# Patient Record
Sex: Female | Born: 1960 | Race: White | Hispanic: No | Smoking: Former smoker
Health system: Southern US, Community
[De-identification: ages and names within clinical notes are randomized; demographics above are authoritative.]

## PROBLEM LIST (undated history)

## (undated) DIAGNOSIS — I499 Cardiac arrhythmia, unspecified: Secondary | ICD-10-CM

## (undated) DIAGNOSIS — E7800 Pure hypercholesterolemia, unspecified: Secondary | ICD-10-CM

## (undated) DIAGNOSIS — I700 Atherosclerosis of aorta: Secondary | ICD-10-CM

## (undated) DIAGNOSIS — E785 Hyperlipidemia, unspecified: Secondary | ICD-10-CM

## (undated) DIAGNOSIS — E039 Hypothyroidism, unspecified: Secondary | ICD-10-CM

## (undated) DIAGNOSIS — R112 Nausea with vomiting, unspecified: Secondary | ICD-10-CM

## (undated) DIAGNOSIS — Z9889 Other specified postprocedural states: Secondary | ICD-10-CM

## (undated) DIAGNOSIS — J439 Emphysema, unspecified: Secondary | ICD-10-CM

## (undated) HISTORY — DX: Hypothyroidism, unspecified: E03.9

## (undated) HISTORY — DX: Hyperlipidemia, unspecified: E78.5

## (undated) HISTORY — DX: Emphysema, unspecified: J43.9

## (undated) HISTORY — DX: Atherosclerosis of aorta: I70.0

## (undated) HISTORY — PX: TONSILLECTOMY: SUR1361

---

## 2001-02-18 ENCOUNTER — Emergency Department (HOSPITAL_COMMUNITY): Admission: EM | Admit: 2001-02-18 | Discharge: 2001-02-18 | Payer: Self-pay | Admitting: Emergency Medicine

## 2001-04-08 ENCOUNTER — Emergency Department (HOSPITAL_COMMUNITY): Admission: EM | Admit: 2001-04-08 | Discharge: 2001-04-08 | Payer: Self-pay | Admitting: *Deleted

## 2001-08-20 ENCOUNTER — Emergency Department (HOSPITAL_COMMUNITY): Admission: EM | Admit: 2001-08-20 | Discharge: 2001-08-20 | Payer: Self-pay | Admitting: Emergency Medicine

## 2001-11-05 ENCOUNTER — Emergency Department (HOSPITAL_COMMUNITY): Admission: EM | Admit: 2001-11-05 | Discharge: 2001-11-05 | Payer: Self-pay | Admitting: Internal Medicine

## 2002-01-06 ENCOUNTER — Emergency Department (HOSPITAL_COMMUNITY): Admission: EM | Admit: 2002-01-06 | Discharge: 2002-01-06 | Payer: Self-pay | Admitting: Emergency Medicine

## 2004-01-06 ENCOUNTER — Emergency Department (HOSPITAL_COMMUNITY): Admission: EM | Admit: 2004-01-06 | Discharge: 2004-01-07 | Payer: Self-pay | Admitting: *Deleted

## 2004-05-09 ENCOUNTER — Encounter (HOSPITAL_COMMUNITY): Admission: RE | Admit: 2004-05-09 | Discharge: 2004-05-10 | Payer: Self-pay | Admitting: Family Medicine

## 2004-05-31 ENCOUNTER — Emergency Department (HOSPITAL_COMMUNITY): Admission: EM | Admit: 2004-05-31 | Discharge: 2004-06-01 | Payer: Self-pay | Admitting: *Deleted

## 2004-06-13 ENCOUNTER — Ambulatory Visit (HOSPITAL_COMMUNITY): Admission: RE | Admit: 2004-06-13 | Discharge: 2004-06-13 | Payer: Self-pay | Admitting: Internal Medicine

## 2005-07-25 ENCOUNTER — Ambulatory Visit (HOSPITAL_COMMUNITY): Admission: RE | Admit: 2005-07-25 | Discharge: 2005-07-25 | Payer: Self-pay | Admitting: Family Medicine

## 2005-07-30 ENCOUNTER — Ambulatory Visit (HOSPITAL_COMMUNITY): Admission: RE | Admit: 2005-07-30 | Discharge: 2005-07-30 | Payer: Self-pay | Admitting: Family Medicine

## 2005-08-08 ENCOUNTER — Ambulatory Visit: Payer: Self-pay | Admitting: Orthopedic Surgery

## 2006-02-15 ENCOUNTER — Emergency Department (HOSPITAL_COMMUNITY): Admission: EM | Admit: 2006-02-15 | Discharge: 2006-02-15 | Payer: Self-pay | Admitting: Emergency Medicine

## 2006-08-09 ENCOUNTER — Ambulatory Visit (HOSPITAL_COMMUNITY): Admission: RE | Admit: 2006-08-09 | Discharge: 2006-08-09 | Payer: Self-pay | Admitting: Family Medicine

## 2008-02-25 ENCOUNTER — Ambulatory Visit (HOSPITAL_COMMUNITY): Admission: RE | Admit: 2008-02-25 | Discharge: 2008-02-25 | Payer: Self-pay | Admitting: Family Medicine

## 2008-08-02 ENCOUNTER — Emergency Department: Payer: Self-pay | Admitting: Emergency Medicine

## 2009-03-28 ENCOUNTER — Ambulatory Visit (HOSPITAL_COMMUNITY): Admission: RE | Admit: 2009-03-28 | Discharge: 2009-03-28 | Payer: Self-pay | Admitting: Family Medicine

## 2009-03-31 ENCOUNTER — Ambulatory Visit (HOSPITAL_COMMUNITY): Admission: RE | Admit: 2009-03-31 | Discharge: 2009-03-31 | Payer: Self-pay | Admitting: Family Medicine

## 2010-03-30 ENCOUNTER — Ambulatory Visit (HOSPITAL_COMMUNITY)
Admission: RE | Admit: 2010-03-30 | Discharge: 2010-03-30 | Payer: Self-pay | Source: Home / Self Care | Attending: Family Medicine | Admitting: Family Medicine

## 2011-03-09 ENCOUNTER — Emergency Department (HOSPITAL_COMMUNITY)
Admission: EM | Admit: 2011-03-09 | Discharge: 2011-03-09 | Disposition: A | Discharge status: Home / Self Care | Payer: BC Managed Care – PPO | Attending: Emergency Medicine | Admitting: Emergency Medicine

## 2011-03-09 ENCOUNTER — Emergency Department (HOSPITAL_COMMUNITY): Payer: BC Managed Care – PPO

## 2011-03-09 ENCOUNTER — Encounter: Payer: Self-pay | Admitting: *Deleted

## 2011-03-09 DIAGNOSIS — R062 Wheezing: Secondary | ICD-10-CM | POA: Insufficient documentation

## 2011-03-09 DIAGNOSIS — R059 Cough, unspecified: Secondary | ICD-10-CM | POA: Insufficient documentation

## 2011-03-09 DIAGNOSIS — E7800 Pure hypercholesterolemia, unspecified: Secondary | ICD-10-CM | POA: Insufficient documentation

## 2011-03-09 DIAGNOSIS — R0789 Other chest pain: Secondary | ICD-10-CM | POA: Insufficient documentation

## 2011-03-09 DIAGNOSIS — R05 Cough: Secondary | ICD-10-CM

## 2011-03-09 DIAGNOSIS — R0602 Shortness of breath: Secondary | ICD-10-CM | POA: Insufficient documentation

## 2011-03-09 DIAGNOSIS — R6883 Chills (without fever): Secondary | ICD-10-CM | POA: Insufficient documentation

## 2011-03-09 DIAGNOSIS — E079 Disorder of thyroid, unspecified: Secondary | ICD-10-CM | POA: Insufficient documentation

## 2011-03-09 DIAGNOSIS — J3489 Other specified disorders of nose and nasal sinuses: Secondary | ICD-10-CM | POA: Insufficient documentation

## 2011-03-09 DIAGNOSIS — F17200 Nicotine dependence, unspecified, uncomplicated: Secondary | ICD-10-CM | POA: Insufficient documentation

## 2011-03-09 DIAGNOSIS — R093 Abnormal sputum: Secondary | ICD-10-CM | POA: Insufficient documentation

## 2011-03-09 HISTORY — DX: Pure hypercholesterolemia, unspecified: E78.00

## 2011-03-09 MED ORDER — GUAIFENESIN-CODEINE 100-10 MG/5ML PO SYRP: 10.0000 mL | ORAL_SOLUTION | Freq: Three times a day (TID) | ORAL | Status: DC | PRN

## 2011-03-09 MED ORDER — IPRATROPIUM BROMIDE 0.02 % IN SOLN
0.5000 mg | Freq: Once | RESPIRATORY_TRACT | Status: AC
  Administered 2011-03-09: 0.5 mg via RESPIRATORY_TRACT
  Filled 2011-03-09: qty 2.5

## 2011-03-09 MED ORDER — ALBUTEROL SULFATE HFA 108 (90 BASE) MCG/ACT IN AERS
2.0000 | INHALATION_SPRAY | RESPIRATORY_TRACT | Status: DC
  Administered 2011-03-09: 2 via RESPIRATORY_TRACT
  Filled 2011-03-09: qty 6.7

## 2011-03-09 MED ORDER — ALBUTEROL SULFATE (5 MG/ML) 0.5% IN NEBU
5.0000 mg | INHALATION_SOLUTION | Freq: Once | RESPIRATORY_TRACT | Status: AC
  Administered 2011-03-09: 5 mg via RESPIRATORY_TRACT
  Filled 2011-03-09: qty 1

## 2011-03-09 MED ORDER — PREDNISONE 10 MG PO TABS: ORAL_TABLET | ORAL | Status: DC

## 2011-03-09 MED ORDER — AZITHROMYCIN 250 MG PO TABS: ORAL_TABLET | ORAL | Status: DC

## 2011-03-09 NOTE — ED Notes (Signed)
Pt presents with SOB and cough x 3 days. Pt denies sputum.

## 2011-03-09 NOTE — ED Provider Notes (Signed)
History     CSN: [id number] Arrival date & time: 03/09/2011  3:52 PM   First MD Initiated Contact with Patient 03/09/11 1619      Chief Complaint  Patient presents with  . Shortness of Breath  . Cough    (Consider location/radiation/quality/duration/timing/severity/associated sxs/prior treatment) Patient is a 50 y.o. female presenting with cough. The history is provided by the patient.  Cough This is a new problem. The current episode started more than 2 days ago. The problem occurs every few minutes. The problem has not changed since onset.The cough is productive of sputum. There has been no fever. Associated symptoms include chills, shortness of breath and wheezing. Pertinent negatives include no chest pain, no headaches, no rhinorrhea, no sore throat and no myalgias. She has tried nothing for the symptoms. The treatment provided no relief. She is a smoker. Her past medical history is significant for bronchitis. Her past medical history does not include pneumonia, COPD or asthma.    Past Medical History  Diagnosis Date  . Thyroid disease   . Hypercholesteremia     Past Surgical History  Procedure Date  . Cesarean section x2  . Tonsillectomy     No family history on file.  History  Substance Use Topics  . Smoking status: Current Everyday Smoker -- 1.0 packs/day    Types: Cigarettes  . Smokeless tobacco: Not on file  . Alcohol Use: No    OB History    Grav Para Term Preterm Abortions TAB SAB Ect Mult Living                  Review of Systems  Constitutional: Positive for chills. Negative for fever and fatigue.  HENT: Positive for congestion. Negative for sore throat, rhinorrhea, trouble swallowing, neck pain and neck stiffness.   Respiratory: Positive for cough, chest tightness, shortness of breath and wheezing.   Cardiovascular: Negative for chest pain.  Gastrointestinal: Negative for vomiting.  Musculoskeletal: Negative for myalgias and arthralgias.  Skin:  Negative for rash.  Neurological: Negative for dizziness, weakness, numbness and headaches.  Hematological: Does not bruise/bleed easily.  All other systems reviewed and are negative.    Allergies  Review of patient's allergies indicates no known allergies.  Home Medications   Current Outpatient Rx  Name Route Sig Dispense Refill  . IBUPROFEN 200 MG PO TABS Oral Take 800 mg by mouth 2 (two) times daily as needed. For fever and pain     . LEVOTHYROXINE SODIUM 75 MCG PO TABS Oral Take 75 mcg by mouth daily.      Marland Kitchen SIMVASTATIN 20 MG PO TABS Oral Take 20 mg by mouth at bedtime.        BP 108/76  Pulse 72  Temp(Src) 98.6 F (37 C) (Oral)  Resp 18  SpO2 99%  Physical Exam  Nursing note and vitals reviewed. Constitutional: She is oriented to person, place, and time. She appears well-developed and well-nourished. No distress.  HENT:  Head: Normocephalic and atraumatic.  Mouth/Throat: Oropharynx is clear and moist.  Eyes: EOM are normal. Pupils are equal, round, and reactive to light.  Neck: Normal range of motion. Neck supple.  Cardiovascular: Normal rate, regular rhythm and normal heart sounds.   No murmur heard. Pulmonary/Chest: Effort normal. No respiratory distress. She has wheezes. She has no rhonchi. She has no rales. She exhibits no tenderness.  Musculoskeletal: Normal range of motion. She exhibits no edema and no tenderness.  Lymphadenopathy:    She has no cervical adenopathy.  Neurological: She is alert and oriented to person, place, and time. No cranial nerve deficit. She exhibits normal muscle tone. Coordination normal.  Skin: Skin is warm and dry.    ED Course  Procedures (including critical care time)  Labs Reviewed - No data to display Dg Chest 2 View  03/09/2011  *RADIOLOGY REPORT*  Clinical Data: Cough, fever and shortness of breath.  CHEST - 2 VIEW  Comparison: Chest 03/31/2009.  Findings: Lungs clear.  Heart size normal.  No pneumothorax or effusion.   IMPRESSION: No acute disease.  Original Report Authenticated By: Bernadene Bell. D'ALESSIO, M.D.        MDM    6:01 PM patient is feeling better after neb.  Wheezes has improved.  Vitals stable.  Non-toxic appearing.  No hypoxia, tachycardia or tachypnea.  Likely bronchitis.  Will treat with abx, steroids and dispensed inhaler.  Pt agrees to close follow-up with health dept or Return to ER for any worsening symtpomns       Kima Malenfant L. [place], [place] 03/12/11 [phone]

## 2011-03-09 NOTE — ED Notes (Signed)
Pt a/ox4. Resp even and unlabored. NAD at this time. D/C instructions and rx reviewed with pt. Pt verbalized understanding. Pt ambulated to lobby with steady gate.  

## 2011-03-09 NOTE — ED Notes (Signed)
Pt c/o sob and cough since x 3 days

## 2011-03-16 NOTE — ED Provider Notes (Signed)
Evaluation and management procedures were performed by the PA/NP under my supervision/collaboration.    Cayne Yom D Ninfa Giannelli, MD 03/16/11 1119 

## 2011-03-19 ENCOUNTER — Emergency Department (HOSPITAL_COMMUNITY): Payer: BC Managed Care – PPO

## 2011-03-19 ENCOUNTER — Encounter (HOSPITAL_COMMUNITY): Payer: Self-pay

## 2011-03-19 ENCOUNTER — Other Ambulatory Visit: Payer: Self-pay

## 2011-03-19 ENCOUNTER — Emergency Department (HOSPITAL_COMMUNITY)
Admission: EM | Admit: 2011-03-19 | Discharge: 2011-03-19 | Disposition: A | Discharge status: Home / Self Care | Payer: BC Managed Care – PPO | Attending: Emergency Medicine | Admitting: Emergency Medicine

## 2011-03-19 DIAGNOSIS — F17200 Nicotine dependence, unspecified, uncomplicated: Secondary | ICD-10-CM | POA: Insufficient documentation

## 2011-03-19 DIAGNOSIS — R0789 Other chest pain: Secondary | ICD-10-CM | POA: Insufficient documentation

## 2011-03-19 DIAGNOSIS — E7800 Pure hypercholesterolemia, unspecified: Secondary | ICD-10-CM | POA: Insufficient documentation

## 2011-03-19 DIAGNOSIS — R071 Chest pain on breathing: Secondary | ICD-10-CM | POA: Insufficient documentation

## 2011-03-19 DIAGNOSIS — R1011 Right upper quadrant pain: Secondary | ICD-10-CM | POA: Insufficient documentation

## 2011-03-19 DIAGNOSIS — E079 Disorder of thyroid, unspecified: Secondary | ICD-10-CM | POA: Insufficient documentation

## 2011-03-19 LAB — URINALYSIS, ROUTINE W REFLEX MICROSCOPIC
Glucose, UA: NEGATIVE mg/dL
Hgb urine dipstick: NEGATIVE
Protein, ur: NEGATIVE mg/dL
Specific Gravity, Urine: 1.02 (ref 1.005–1.030)
pH: 6 (ref 5.0–8.0)

## 2011-03-19 LAB — COMPREHENSIVE METABOLIC PANEL
Albumin: 3.7 g/dL (ref 3.5–5.2)
Alkaline Phosphatase: 63 U/L (ref 39–117)
BUN: 15 mg/dL (ref 6–23)
Creatinine, Ser: 0.73 mg/dL (ref 0.50–1.10)
GFR calc Af Amer: >90 mL/min (ref >90)
Glucose, Bld: 101 mg/dL — ABNORMAL HIGH (ref 70–99)
Potassium: 4.3 mEq/L (ref 3.5–5.1)
Total Bilirubin: 0.3 mg/dL (ref 0.3–1.2)
Total Protein: 7.1 g/dL (ref 6.0–8.3)

## 2011-03-19 LAB — DIFFERENTIAL
Basophils Relative: 0 % (ref 0–1)
Eosinophils Absolute: 0.2 10*3/uL (ref 0.0–0.7)
Lymphs Abs: 2.7 10*3/uL (ref 0.7–4.0)
Monocytes Absolute: 0.7 10*3/uL (ref 0.1–1.0)
Monocytes Relative: 8 % (ref 3–12)
Neutrophils Relative %: 57 % (ref 43–77)

## 2011-03-19 LAB — LIPASE, BLOOD: Lipase: 59 U/L (ref 11–59)

## 2011-03-19 LAB — CBC
HCT: 42.4 % (ref 36.0–46.0)
Hemoglobin: 14.2 g/dL (ref 12.0–15.0)
MCH: 30 pg (ref 26.0–34.0)
MCHC: 33.5 g/dL (ref 30.0–36.0)
MCV: 89.5 fL (ref 78.0–100.0)
RBC: 4.74 MIL/uL (ref 3.87–5.11)

## 2011-03-19 MED ORDER — HYDROCODONE-ACETAMINOPHEN 5-325 MG PO TABS
2.0000 | ORAL_TABLET | ORAL | Status: DC | PRN
  Filled 2011-03-19: qty 2

## 2011-03-19 MED ORDER — IBUPROFEN 600 MG PO TABS: 600.0000 mg | ORAL_TABLET | Freq: Three times a day (TID) | ORAL | Status: AC | PRN

## 2011-03-19 MED ORDER — HYDROMORPHONE HCL PF 1 MG/ML IJ SOLN
1.0000 mg | Freq: Once | INTRAMUSCULAR | Status: DC
  Filled 2011-03-19: qty 1

## 2011-03-19 MED ORDER — ONDANSETRON 8 MG PO TBDP
8.0000 mg | ORAL_TABLET | Freq: Once | ORAL | Status: AC
  Administered 2011-03-19: 8 mg via ORAL
  Filled 2011-03-19: qty 1

## 2011-03-19 MED ORDER — IBUPROFEN 400 MG PO TABS
600.0000 mg | ORAL_TABLET | Freq: Three times a day (TID) | ORAL | Status: DC | PRN
  Administered 2011-03-19: 600 mg via ORAL
  Filled 2011-03-19: qty 2

## 2011-03-19 MED ORDER — HYDROCODONE-ACETAMINOPHEN 5-325 MG PO TABS: 2.0000 | ORAL_TABLET | ORAL | Status: AC | PRN

## 2011-03-19 NOTE — ED Provider Notes (Signed)
Scribed for Felisa Bonier, MD, the patient was seen in room [facility] . This chart was scribed by Ellie Lunch.   CSN: [id number] Arrival date & time: 03/19/2011  8:04 AM   First MD Initiated Contact with Patient 03/19/11 [phone]      Chief Complaint  Patient presents with  . Chest Pain    (Consider location/radiation/quality/duration/timing/severity/associated sxs/prior treatment) HPI Pt seen at 8:45 AM Alejandra Mcdaniel is a 50 y.o. female who presents to the Emergency Department complaining of sudden onset anterior chest wall pain located under right breast for the past week. Pt states that the pain started last week and describes it as sharp pains that radiate with palpation to her back. Pt reports the pain worsening with coughing, movement and eating. She has some associated shortness of breath and nausea but denies any fever or palpitations. Pt was treated with bronchitis last week and her symptoms have significantly improved since then.   Past Medical History  Diagnosis Date  . Thyroid disease   . Hypercholesteremia     Past Surgical History  Procedure Date  . Cesarean section x2  . Tonsillectomy     No family history on file.  History  Substance Use Topics  . Smoking status: Current Everyday Smoker -- 1.0 packs/day    Types: Cigarettes  . Smokeless tobacco: Not on file  . Alcohol Use: No     Review of Systems 10 Systems reviewed and are negative for acute change except as noted in the HPI.   Allergies  Review of patient's allergies indicates no known allergies.  Home Medications   Current Outpatient Rx  Name Route Sig Dispense Refill  . AZITHROMYCIN 250 MG PO TABS  Take two tablets on day one, then one tab qd days 2-5 6 tablet 0  . GUAIFENESIN-CODEINE 100-10 MG/5ML PO SYRP Oral Take 10 mLs by mouth 3 (three) times daily as needed for cough. 120 mL 0  . IBUPROFEN 200 MG PO TABS Oral Take 800 mg by mouth 2 (two) times daily as needed. For fever and pain      . LEVOTHYROXINE SODIUM 75 MCG PO TABS Oral Take 75 mcg by mouth daily.      Marland Kitchen PREDNISONE 10 MG PO TABS  Take 6 tablets day one, 5 tablets day two, 4 tablets day three, 3 tablets day four, 2 tablets day five, then 1 tablet day six 21 tablet 0  . SIMVASTATIN 20 MG PO TABS Oral Take 20 mg by mouth at bedtime.        BP 105/73  Pulse 79  Temp(Src) 98.2 F (36.8 C) (Oral)  Resp 18  Ht 5\' 2"  (1.575 m)  Wt 140 lb (63.504 kg)  BMI 25.61 kg/m2  SpO2 95%  Physical Exam  Nursing note and vitals reviewed. Constitutional: She is oriented to person, place, and time. She appears well-developed and well-nourished. No distress.  HENT:  Head: Normocephalic and atraumatic.  Eyes: Conjunctivae are normal. Pupils are equal, round, and reactive to light.  Neck: Normal range of motion. Neck supple.  Cardiovascular: Normal rate, regular rhythm and normal heart sounds.  Exam reveals no gallop and no friction rub.   No murmur heard. Pulmonary/Chest: She has wheezes (subtle inspiratory wheeze bilaterally). She has no rales.       No rhonchi Chest wall is tender to right lower anterior aspect with no deformity or crepitus  Abdominal: Soft. Bowel sounds are normal. There is tenderness (RUQ tenderness noted). There is no rebound  and no guarding.  Neurological: She is alert and oriented to person, place, and time.  Skin: Skin is warm and dry.    ED Course  Procedures (including critical care time)  Date: 03/19/2011  Rate: 61  Rhythm: normal sinus rhythm  QRS Axis: normal  Intervals: normal  ST/T Wave abnormalities: normal  Conduction Disutrbances:none  Narrative Interpretation: Non-provocative EKG  Old EKG Reviewed: none available   Labs Reviewed - No data to display No results found. DIAGNOSTIC STUDIES: Oxygen Saturation is 99% on room air, normal by my interpretation.    COORDINATION OF CARE:     No diagnosis found.    MDM  8:50 AM Acute cholecystitis, cholithiasis, pancreatitis,  bronchitis, musculoskeletal chest pain. Acute mycardial infarction is not suggested by the physical exam or history.   I personally performed the services described in this documentation, which was scribed in my presence. The recorded information has been reviewed and considered.   10:43 AM No apparent intra-abdominal or cardiac cause of the patient's symptoms. She appears to have chest wall pain and resolving bronchitis.  Felisa Bonier, MD 03/19/11 [phone]

## 2011-03-19 NOTE — ED Notes (Signed)
Pt reports was seen here  A week ago with bronchitis.  Reports had been coughing a lot.  Pt says last Tuesday pt started having pain under R breast radiating around to back.  Reports pain is much worse with movement and coughing.

## 2011-03-20 ENCOUNTER — Other Ambulatory Visit (HOSPITAL_COMMUNITY): Payer: Self-pay | Admitting: Family Medicine

## 2011-03-20 DIAGNOSIS — Z139 Encounter for screening, unspecified: Secondary | ICD-10-CM

## 2011-04-02 ENCOUNTER — Ambulatory Visit (HOSPITAL_COMMUNITY)
Admission: RE | Admit: 2011-04-02 | Discharge: 2011-04-02 | Disposition: A | Discharge status: Home / Self Care | Payer: BC Managed Care – PPO | Source: Ambulatory Visit | Attending: Family Medicine | Admitting: Family Medicine

## 2011-04-02 DIAGNOSIS — Z139 Encounter for screening, unspecified: Secondary | ICD-10-CM

## 2011-04-02 DIAGNOSIS — Z1231 Encounter for screening mammogram for malignant neoplasm of breast: Secondary | ICD-10-CM | POA: Insufficient documentation

## 2011-07-16 ENCOUNTER — Other Ambulatory Visit (HOSPITAL_COMMUNITY)
Admission: RE | Admit: 2011-07-16 | Discharge: 2011-07-16 | Disposition: A | Discharge status: Home / Self Care | Payer: BC Managed Care – PPO | Source: Ambulatory Visit | Attending: Obstetrics & Gynecology | Admitting: Obstetrics & Gynecology

## 2011-07-16 DIAGNOSIS — Z01419 Encounter for gynecological examination (general) (routine) without abnormal findings: Secondary | ICD-10-CM | POA: Insufficient documentation

## 2012-04-09 ENCOUNTER — Other Ambulatory Visit (HOSPITAL_COMMUNITY): Payer: Self-pay | Admitting: Family Medicine

## 2012-04-09 DIAGNOSIS — Z139 Encounter for screening, unspecified: Secondary | ICD-10-CM

## 2012-04-14 ENCOUNTER — Ambulatory Visit (HOSPITAL_COMMUNITY)
Admission: RE | Admit: 2012-04-14 | Discharge: 2012-04-14 | Disposition: A | Discharge status: Home / Self Care | Payer: BC Managed Care – PPO | Source: Ambulatory Visit | Attending: Family Medicine | Admitting: Family Medicine

## 2012-04-14 DIAGNOSIS — Z139 Encounter for screening, unspecified: Secondary | ICD-10-CM

## 2012-04-14 DIAGNOSIS — Z1231 Encounter for screening mammogram for malignant neoplasm of breast: Secondary | ICD-10-CM | POA: Insufficient documentation

## 2012-10-23 ENCOUNTER — Other Ambulatory Visit: Payer: Self-pay | Admitting: Obstetrics & Gynecology

## 2012-11-10 ENCOUNTER — Encounter: Payer: Self-pay | Admitting: Obstetrics & Gynecology

## 2012-11-10 ENCOUNTER — Other Ambulatory Visit (HOSPITAL_COMMUNITY)
Admission: RE | Admit: 2012-11-10 | Discharge: 2012-11-10 | Disposition: A | Discharge status: Home / Self Care | Payer: BC Managed Care – PPO | Source: Ambulatory Visit | Attending: Obstetrics & Gynecology | Admitting: Obstetrics & Gynecology

## 2012-11-10 ENCOUNTER — Ambulatory Visit (INDEPENDENT_AMBULATORY_CARE_PROVIDER_SITE_OTHER): Payer: BC Managed Care – PPO | Admitting: Obstetrics & Gynecology

## 2012-11-10 VITALS — BP 100/60 | Ht 62.0 in | Wt 140.0 lb

## 2012-11-10 DIAGNOSIS — Z01419 Encounter for gynecological examination (general) (routine) without abnormal findings: Secondary | ICD-10-CM | POA: Insufficient documentation

## 2012-11-10 DIAGNOSIS — Z1151 Encounter for screening for human papillomavirus (HPV): Secondary | ICD-10-CM | POA: Insufficient documentation

## 2012-11-10 MED ORDER — METRONIDAZOLE 0.75 % VA GEL: 1.0000 | VAGINAL | Status: DC

## 2012-11-10 MED ORDER — ESTRADIOL 0.1 MG/GM VA CREA: 2.0000 g | TOPICAL_CREAM | Freq: Every day | VAGINAL | Status: DC

## 2012-11-10 NOTE — Progress Notes (Signed)
Patient ID: Alejandra Mcdaniel, female   DOB: 09-08-1960, 52 y.o.   MRN: [id number] Subjective:     Alejandra Mcdaniel is a 52 y.o. female here for a routine exam.  No LMP recorded. Patient is postmenopausal. No obstetric history on file. Current complaints: none.  Personal health questionnaire reviewed: no.   Gynecologic History No LMP recorded. Patient is postmenopausal. Contraception: post menopausal status Last Pap: 2013. Results were: normal Last mammogram: 2013. Results were: normal  Obstetric History OB History   Grav Para Term Preterm Abortions TAB SAB Ect Mult Living                   The following portions of the patient's history were reviewed and updated as appropriate: allergies, current medications, past family history, past medical history, past social history, past surgical history and problem list.  Review of Systems  Review of Systems  Constitutional: Negative for fever, chills, weight loss, malaise/fatigue and diaphoresis.  HENT: Negative for hearing loss, ear pain, nosebleeds, congestion, sore throat, neck pain, tinnitus and ear discharge.   Eyes: Negative for blurred vision, double vision, photophobia, pain, discharge and redness.  Respiratory: Negative for cough, hemoptysis, sputum production, shortness of breath, wheezing and stridor.   Cardiovascular: Negative for chest pain, palpitations, orthopnea, claudication, leg swelling and PND.  Gastrointestinal: negative for abdominal pain. Negative for heartburn, nausea, vomiting, diarrhea, constipation, blood in stool and melena.  Genitourinary: Negative for dysuria, urgency, frequency, hematuria and flank pain.  Musculoskeletal: Negative for myalgias, back pain, joint pain and falls.  Skin: Negative for itching and rash.  Neurological: Negative for dizziness, tingling, tremors, sensory change, speech change, focal weakness, seizures, loss of consciousness, weakness and headaches.  Endo/Heme/Allergies: Negative for  environmental allergies and polydipsia. Does not bruise/bleed easily.  Psychiatric/Behavioral: Negative for depression, suicidal ideas, hallucinations, memory loss and substance abuse. The patient is not nervous/anxious and does not have insomnia.        Objective:    Physical Exam  Vitals reviewed. Constitutional: She is oriented to person, place, and time. She appears well-developed and well-nourished.  HENT:  Head: Normocephalic and atraumatic.        Right Ear: External ear normal.  Left Ear: External ear normal.  Nose: Nose normal.  Mouth/Throat: Oropharynx is clear and moist.  Eyes: Conjunctivae and EOM are normal. Pupils are equal, round, and reactive to light. Right eye exhibits no discharge. Left eye exhibits no discharge. No scleral icterus.  Neck: Normal range of motion. Neck supple. No tracheal deviation present. No thyromegaly present.  Cardiovascular: Normal rate, regular rhythm, normal heart sounds and intact distal pulses.  Exam reveals no gallop and no friction rub.   No murmur heard. Respiratory: Effort normal and breath sounds normal. No respiratory distress. She has no wheezes. She has no rales. She exhibits no tenderness.  GI: Soft. Bowel sounds are normal. She exhibits no distension and no mass. There is no tenderness. There is no rebound and no guarding.  Genitourinary:       Vulva is normal without lesions Vagina is pink moist without discharge Cervix normal in appearance and pap is done Uterus is normal size shape and contour Adnexa is negative with normal sized ovaries  Rectal hemeoccult negative, no masses, normal tone  Musculoskeletal: Normal range of motion. She exhibits no edema and no tenderness.  Neurological: She is alert and oriented to person, place, and time. She has normal reflexes. She displays normal reflexes. No cranial nerve deficit. She exhibits  normal muscle tone. Coordination normal.  Skin: Skin is warm and dry. No rash noted. No erythema. No  pallor.  Psychiatric: She has a normal mood and affect. Her behavior is normal. Judgment and thought content normal.       Assessment:    Healthy female exam.    Plan:    Mammogram ordered. Follow up in: 1 year.

## 2012-11-10 NOTE — Patient Instructions (Signed)
Bacterial Vaginosis Bacterial vaginosis (BV) is a vaginal infection where the normal balance of bacteria in the vagina is disrupted. The normal balance is then replaced by an overgrowth of certain bacteria. There are several different kinds of bacteria that can cause BV. BV is the most common vaginal infection in women of childbearing age. CAUSES   The cause of BV is not fully understood. BV develops when there is an increase or imbalance of harmful bacteria.  Some activities or behaviors can upset the normal balance of bacteria in the vagina and put women at increased risk including:  Having a new sex partner or multiple sex partners.  Douching.  Using an intrauterine device (IUD) for contraception.  It is not clear what role sexual activity plays in the development of BV. However, women that have never had sexual intercourse are rarely infected with BV. Women do not get BV from toilet seats, bedding, swimming pools or from touching objects around them.  SYMPTOMS   Grey vaginal discharge.  A fish-like odor with discharge, especially after sexual intercourse.  Itching or burning of the vagina and vulva.  Burning or pain with urination.  Some women have no signs or symptoms at all. DIAGNOSIS  Your caregiver must examine the vagina for signs of BV. Your caregiver will perform lab tests and look at the sample of vaginal fluid through a microscope. They will look for bacteria and abnormal cells (clue cells), a pH test higher than 4.5, and a positive amine test all associated with BV.  RISKS AND COMPLICATIONS   Pelvic inflammatory disease (PID).  Infections following gynecology surgery.  Developing HIV.  Developing herpes virus. TREATMENT  Sometimes BV will clear up without treatment. However, all women with symptoms of BV should be treated to avoid complications, especially if gynecology surgery is planned. Female partners generally do not need to be treated. However, BV may spread  between female sex partners so treatment is helpful in preventing a recurrence of BV.   BV may be treated with antibiotics. The antibiotics come in either pill or vaginal cream forms. Either can be used with nonpregnant or pregnant women, but the recommended dosages differ. These antibiotics are not harmful to the baby.  BV can recur after treatment. If this happens, a second round of antibiotics will often be prescribed.  Treatment is important for pregnant women. If not treated, BV can cause a premature delivery, especially for a pregnant woman who had a premature birth in the past. All pregnant women who have symptoms of BV should be checked and treated.  For chronic reoccurrence of BV, treatment with a type of prescribed gel vaginally twice a week is helpful. HOME CARE INSTRUCTIONS   Finish all medication as directed by your caregiver.  Do not have sex until treatment is completed.  Tell your sexual partner that you have a vaginal infection. They should see their caregiver and be treated if they have problems, such as a mild rash or itching.  Practice safe sex. Use condoms. Only have 1 sex partner. PREVENTION  Basic prevention steps can help reduce the risk of upsetting the natural balance of bacteria in the vagina and developing BV:  Do not have sexual intercourse (be abstinent).  Do not douche.  Use all of the medicine prescribed for treatment of BV, even if the signs and symptoms go away.  Tell your sex partner if you have BV. That way, they can be treated, if needed, to prevent reoccurrence. SEEK MEDICAL CARE IF:     Your symptoms are not improving after 3 days of treatment.  You have increased discharge, pain, or fever. MAKE SURE YOU:   Understand these instructions.  Will watch your condition.  Will get help right away if you are not doing well or get worse. FOR MORE INFORMATION  Division of STD Prevention (DSTDP), Centers for Disease Control and Prevention:  www.cdc.gov/std American [employer] Association (ASHA): www.ashastd.org  Document Released: 03/19/2005 Document Revised: 06/11/2011 Document Reviewed: 09/09/2008 [employer] Patient Information 2014 [employer].  

## 2012-11-10 NOTE — Addendum Note (Signed)
Addended by: Richardson Chiquito on: 11/10/2012 05:00 PM   Modules accepted: Orders

## 2014-10-11 ENCOUNTER — Other Ambulatory Visit: Payer: Self-pay | Admitting: Family Medicine

## 2014-10-11 DIAGNOSIS — E7800 Pure hypercholesterolemia, unspecified: Secondary | ICD-10-CM | POA: Insufficient documentation

## 2014-10-11 DIAGNOSIS — E039 Hypothyroidism, unspecified: Secondary | ICD-10-CM | POA: Insufficient documentation

## 2014-10-11 DIAGNOSIS — E038 Other specified hypothyroidism: Secondary | ICD-10-CM

## 2014-10-11 DIAGNOSIS — Z5181 Encounter for therapeutic drug level monitoring: Secondary | ICD-10-CM | POA: Insufficient documentation

## 2014-10-11 NOTE — Telephone Encounter (Signed)
Due for fasting labs please I put in orders for TSH, lipids, sgpt Ask her to make lab appt in the next week or two I sent Rx Thanks

## 2014-10-11 NOTE — Telephone Encounter (Signed)
Routing to provider  

## 2014-10-28 DIAGNOSIS — E785 Hyperlipidemia, unspecified: Secondary | ICD-10-CM | POA: Insufficient documentation

## 2014-10-28 DIAGNOSIS — E039 Hypothyroidism, unspecified: Secondary | ICD-10-CM | POA: Insufficient documentation

## 2014-10-29 ENCOUNTER — Ambulatory Visit (INDEPENDENT_AMBULATORY_CARE_PROVIDER_SITE_OTHER): Payer: BLUE CROSS/BLUE SHIELD | Admitting: Family Medicine

## 2014-10-29 ENCOUNTER — Encounter: Payer: Self-pay | Admitting: Family Medicine

## 2014-10-29 VITALS — BP 108/72 | HR 56 | Temp 97.7°F | Ht 62.75 in | Wt 133.0 lb

## 2014-10-29 DIAGNOSIS — Z0000 Encounter for general adult medical examination without abnormal findings: Secondary | ICD-10-CM | POA: Diagnosis not present

## 2014-10-29 DIAGNOSIS — E785 Hyperlipidemia, unspecified: Secondary | ICD-10-CM

## 2014-10-29 DIAGNOSIS — Z1159 Encounter for screening for other viral diseases: Secondary | ICD-10-CM

## 2014-10-29 DIAGNOSIS — Z720 Tobacco use: Secondary | ICD-10-CM

## 2014-10-29 DIAGNOSIS — Z1239 Encounter for other screening for malignant neoplasm of breast: Secondary | ICD-10-CM | POA: Diagnosis not present

## 2014-10-29 DIAGNOSIS — Z1211 Encounter for screening for malignant neoplasm of colon: Secondary | ICD-10-CM | POA: Diagnosis not present

## 2014-10-29 DIAGNOSIS — N9489 Other specified conditions associated with female genital organs and menstrual cycle: Secondary | ICD-10-CM

## 2014-10-29 DIAGNOSIS — Z124 Encounter for screening for malignant neoplasm of cervix: Secondary | ICD-10-CM

## 2014-10-29 DIAGNOSIS — Z113 Encounter for screening for infections with a predominantly sexual mode of transmission: Secondary | ICD-10-CM

## 2014-10-29 DIAGNOSIS — N898 Other specified noninflammatory disorders of vagina: Secondary | ICD-10-CM

## 2014-10-29 DIAGNOSIS — E039 Hypothyroidism, unspecified: Secondary | ICD-10-CM

## 2014-10-29 DIAGNOSIS — Z789 Other specified health status: Secondary | ICD-10-CM

## 2014-10-29 LAB — WET PREP FOR TRICH, YEAST, CLUE
Clue Cell Exam: NEGATIVE
TRICHOMONAS EXAM: NEGATIVE
Yeast Exam: NEGATIVE

## 2014-10-29 NOTE — Progress Notes (Signed)
Patient ID: Alejandra Mcdaniel, female   DOB: [birth year], 54 y.o.   MRN: [id number]   Subjective:   Alejandra Mcdaniel is a 54 y.o. female here for a complete physical exam  Interim issues since last visit: none Last pap smear was 2-3 years ago No hx of abnormal paps Does breast exams, no lumps or bumps  no alcohol  Depression screen PHQ 2/9 10/29/2014  Decreased Interest 0  Down, Depressed, Hopeless 0  PHQ - 2 Score 0   BP excellent today Abdominal fat collection over last few years; mother and father have both had bypasses She does not take aspirin or cholesterol medicine Bad aches from statin; started taking fish oil pills  Used to smoke until Sept of 2014, switched to nicotine electronic cigarettes; discussed risk of B.O. And flavorings No fam hx of breast/ovarian cancer Pap smear today Check fasting lipids off of statin and on fish oil Glucose was normal in May Colonoscopy screening: never had any screening No tanning, but used to tan Diet/exercise: eats one a time (supper); less than 3 eggs per week; diet low in saturated fats  Past Medical History  Diagnosis Date  . Hyperlipidemia   . Hypothyroidism    Past Surgical History  Procedure Laterality Date  . Cesarean section      x 2  . Tonsillectomy     Family History  Problem Relation Age of Onset  . Cancer Mother     cervical  . Heart disease Mother   . Diabetes Mother   . Hyperlipidemia Mother   . Heart disease Father   . Diabetes Father   . Hyperlipidemia Father   . Thyroid disease Father   . Diabetes Sister   . COPD Sister   . Cancer Brother     kidney  . Diabetes Brother   . Hypertension Brother    History  Substance Use Topics  . Smoking status: Former Smoker -- 2.00 packs/day for 40 years    Types: Cigarettes, E-cigarettes    Quit date: 12/01/2012  . Smokeless tobacco: Never Used  . Alcohol Use: No   Review of Systems  Objective:   Filed Vitals:   10/29/14 1339  BP: 108/72  Pulse: 56   Temp: 97.7 F (36.5 C)  Height: 5' 2.75" (1.594 m)  Weight: 133 lb (60.328 kg)  SpO2: 98%   Body mass index is 23.74 kg/(m^2). Wt Readings from Last 3 Encounters:  10/29/14 133 lb (60.328 kg)  08/05/14 128 lb (58.06 kg)   Physical Exam  Constitutional: She appears well-developed and well-nourished.  HENT:  Head: Normocephalic and atraumatic.  Eyes: Conjunctivae and EOM are normal. Right eye exhibits no hordeolum. Left eye exhibits no hordeolum. No scleral icterus.  Neck: Carotid bruit is not present. No thyromegaly present.  Cardiovascular: Normal rate, regular rhythm, S1 normal, S2 normal and normal heart sounds.   No extrasystoles are present.  Pulmonary/Chest: Effort normal and breath sounds normal. No respiratory distress. Right breast exhibits no inverted nipple, no mass, no nipple discharge, no skin change and no tenderness. Left breast exhibits no inverted nipple, no mass, no nipple discharge, no skin change and no tenderness. Breasts are symmetrical.  Abdominal: Soft. Normal appearance and bowel sounds are normal. She exhibits no distension, no abdominal bruit, no pulsatile midline mass and no mass. There is no hepatosplenomegaly. There is no tenderness. No hernia.  Genitourinary: Uterus normal. Pelvic exam was performed with patient prone. There is no rash or lesion on the right labia. There  is no rash or lesion on the left labia. Cervix exhibits no motion tenderness. Right adnexum displays no mass, no tenderness and no fullness. Left adnexum displays no mass, no tenderness and no fullness. Vaginal discharge (scant thin discharge; collected for wet moutn) found.  Musculoskeletal: Normal range of motion. She exhibits no edema.  Lymphadenopathy:       Head (right side): No submandibular adenopathy present.       Head (left side): No submandibular adenopathy present.    She has no cervical adenopathy.    She has no axillary adenopathy.  Neurological: She is alert. She displays no  tremor. No cranial nerve deficit. She exhibits normal muscle tone. Gait normal.  Skin: Skin is warm and dry. No bruising and no ecchymosis noted. No cyanosis. No pallor.  Psychiatric: Her speech is normal and behavior is normal. Thought content normal. Her mood appears not anxious. She does not exhibit a depressed mood.    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; on fish oil; fasting except for 1/2 minute maid peach soda; check today      Relevant Orders   Lipid Panel w/o Chol/HDL Ratio (Completed)   Colon cancer screening    Recommended colonoscopy, should have started at age 35; patient now willing to go      Relevant Orders   Ambulatory referral to Gastroenterology   Breast cancer screening    CBE today; SBE monthly; mammograms every 1-2 years      Relevant Orders   MM DIGITAL SCREENING BILATERAL   Electronic cigarette use    Warned of flavored cigarettes, vapors, bronchiolitis obliterans; complete cessation would be best      Preventative health care - Primary    Age-appropriate guidance and screening based on A and B USPSTF recommendations as of April 9563 publication      Screening for cervical cancer    Thin prep collected with HPV co-testing      Relevant Orders   Pap liquid-based and HPV (high risk)    Other Visit Diagnoses    Screening for viral disease        Relevant Orders    HIV antibody (with reflex) (Completed)    Hepatitis B Surface AntiBODY (Completed)    Hepatitis B Surface AntiGEN (Completed)    Hepatitis C Antibody (Completed)    Vaginal odor        Relevant Orders    WET PREP FOR [place], YEAST, CLUE (Completed)    Screen for sexually transmitted diseases        Relevant Orders    RPR (Completed)        Meds ordered this encounter  Medications  . DISCONTD:  levothyroxine (SYNTHROID, LEVOTHROID) 50 MCG tablet    Sig: Take 50 mcg by mouth daily before breakfast. Take 1 one day and 1 1/2 the next day  . cholecalciferol (VITAMIN D) 1000 UNITS tablet    Sig: Take 1,000 Units by mouth daily.  . Omega-3 Fatty Acids (FISH OIL PO)    Sig: Take by mouth 2 (two) times daily.   Orders Placed This Encounter  Procedures  . WET PREP FOR [place], YEAST, CLUE  . MM DIGITAL SCREENING BILATERAL    Standing Status: Future  Number of Occurrences:      Standing Expiration Date: 10/29/2015    Order Specific Question:  Reason for Exam (SYMPTOM  OR DIAGNOSIS REQUIRED)    Answer:  screening    Order Specific Question:  Is the patient pregnant?    Answer:  No    Order Specific Question:  Preferred imaging location?    Answer:  [facility]  . HIV antibody (with reflex)  . Hepatitis B Surface AntiBODY  . Hepatitis B Surface AntiGEN  . Hepatitis C Antibody  . RPR  . TSH  . Lipid Panel w/o Chol/HDL Ratio  . Ambulatory referral to Gastroenterology    Referral Priority:  Routine    Referral Type:  Consultation    Referral Reason:  Specialty Services Required    Number of Visits Requested:  1    Follow up plan: Return in about 1 year (around 10/29/2015) for physical.  An after-visit summary was printed and given to the patient at [place].  Please see the patient instructions which may contain other information and recommendations beyond what is mentioned above in the assessment and plan.

## 2014-10-29 NOTE — Patient Instructions (Addendum)
Please call [place] after your 54th birthday to get a low dose chest CT scheduled Please call to schedule a mammogram We'll call you about the lab results Start 81 mg coated aspirin daily I've put in the referral for you to have a colonoscopy I would recommend (strongly) that you quit using the vapor cigarettes  Health Maintenance Adopting a healthy lifestyle and getting preventive care can go a long way to promote health and wellness. Talk with your health care provider about what schedule of regular examinations is right for you. This is a good chance for you to check in with your provider about disease prevention and staying healthy. In between checkups, there are plenty of things you can do on your own. Experts have done a lot of research about which lifestyle changes and preventive measures are most likely to keep you healthy. Ask your health care provider for more information. WEIGHT AND DIET  Eat a healthy diet  Be sure to include plenty of vegetables, fruits, low-fat dairy products, and lean protein.  Do not eat a lot of foods high in solid fats, added sugars, or salt.  Get regular exercise. This is one of the most important things you can do for your health.  Most adults should exercise for at least 150 minutes each week. The exercise should increase your heart rate and make you sweat (moderate-intensity exercise).  Most adults should also do strengthening exercises at least twice a week. This is in addition to the moderate-intensity exercise.  Maintain a healthy weight  Body mass index (BMI) is a measurement that can be used to identify possible weight problems. It estimates body fat based on height and weight. Your health care provider can help determine your BMI and help you achieve or maintain a healthy weight.  For females 24 years of age and older:   A BMI below 18.5 is considered underweight.  A BMI of 18.5 to 24.9 is normal.  A BMI of 25 to 29.9 is considered  overweight.  A BMI of 30 and above is considered obese.  Watch levels of cholesterol and blood lipids  You should start having your blood tested for lipids and cholesterol at 55 years of age, then have this test every 5 years.  You may need to have your cholesterol levels checked more often if:  Your lipid or cholesterol levels are high.  You are older than 55 years of age.  You are at high risk for heart disease.  CANCER SCREENING   Lung Cancer  Lung cancer screening is recommended for adults 8-78 years old who are at high risk for lung cancer because of a history of smoking.  A yearly low-dose CT scan of the lungs is recommended for people who:  Currently smoke.  Have quit within the past 15 years.  Have at least a 30-pack-year history of smoking. A pack year is smoking an average of one pack of cigarettes a day for 1 year.  Yearly screening should continue until it has been 15 years since you quit.  Yearly screening should stop if you develop a health problem that would prevent you from having lung cancer treatment.  Breast Cancer  Practice breast self-awareness. This means understanding how your breasts normally appear and feel.  It also means doing regular breast self-exams. Let your health care provider know about any changes, no matter how small.  If you are in your 20s or 30s, you should have a clinical breast exam (CBE) by a  health care provider every 1-3 years as part of a regular health exam.  If you are 26 or older, have a CBE every year. Also consider having a breast X-ray (mammogram) every year.  If you have a family history of breast cancer, talk to your health care provider about genetic screening.  If you are at high risk for breast cancer, talk to your health care provider about having an MRI and a mammogram every year.  Breast cancer gene (BRCA) assessment is recommended for women who have family members with BRCA-related cancers. BRCA-related  cancers include:  Breast.  Ovarian.  Tubal.  Peritoneal cancers.  Results of the assessment will determine the need for genetic counseling and BRCA1 and BRCA2 testing. Cervical Cancer Routine pelvic examinations to screen for cervical cancer are no longer recommended for nonpregnant women who are considered low risk for cancer of the pelvic organs (ovaries, uterus, and vagina) and who do not have symptoms. A pelvic examination may be necessary if you have symptoms including those associated with pelvic infections. Ask your health care provider if a screening pelvic exam is right for you.   The Pap test is the screening test for cervical cancer for women who are considered at risk.  If you had a hysterectomy for a problem that was not cancer or a condition that could lead to cancer, then you no longer need Pap tests.  If you are older than 65 years, and you have had normal Pap tests for the past 10 years, you no longer need to have Pap tests.  If you have had past treatment for cervical cancer or a condition that could lead to cancer, you need Pap tests and screening for cancer for at least 20 years after your treatment.  If you no longer get a Pap test, assess your risk factors if they change (such as having a new sexual partner). This can affect whether you should start being screened again.  Some women have medical problems that increase their chance of getting cervical cancer. If this is the case for you, your health care provider may recommend more frequent screening and Pap tests.  The human papillomavirus (HPV) test is another test that may be used for cervical cancer screening. The HPV test looks for the virus that can cause cell changes in the cervix. The cells collected during the Pap test can be tested for HPV.  The HPV test can be used to screen women 22 years of age and older. Getting tested for HPV can extend the interval between normal Pap tests from three to five  years.  An HPV test also should be used to screen women of any age who have unclear Pap test results.  After 55 years of age, women should have HPV testing as often as Pap tests.  Colorectal Cancer  This type of cancer can be detected and often prevented.  Routine colorectal cancer screening usually begins at 55 years of age and continues through 55 years of age.  Your health care provider may recommend screening at an earlier age if you have risk factors for colon cancer.  Your health care provider may also recommend using home test kits to check for hidden blood in the stool.  A small camera at the end of a tube can be used to examine your colon directly (sigmoidoscopy or colonoscopy). This is done to check for the earliest forms of colorectal cancer.  Routine screening usually begins at age 60.  Direct examination of  colon should be repeated every 5-10 years through 55 years of age. However, you may need to be screened more often if early forms of precancerous polyps or small growths are found. Skin Cancer  Check your skin from head to toe regularly.  Tell your health care provider about any new moles or changes in moles, especially if there is a change in a mole's shape or color.  Also tell your health care provider if you have a mole that is larger than the size of a pencil eraser.  Always use sunscreen. Apply sunscreen liberally and repeatedly throughout the day.  Protect yourself by wearing long sleeves, pants, a wide-brimmed hat, and sunglasses whenever you are outside. HEART DISEASE, DIABETES, AND HIGH BLOOD PRESSURE   Have your blood pressure checked at least every 1-2 years. High blood pressure causes heart disease and increases the risk of stroke.  If you are between 55 years and 79 years old, ask your health care provider if you should take aspirin to prevent strokes.  Have regular diabetes screenings. This involves taking a blood sample to check your fasting  blood sugar level.  If you are at a normal weight and have a low risk for diabetes, have this test once every three years after 55 years of age.  If you are overweight and have a high risk for diabetes, consider being tested at a younger age or more often. PREVENTING INFECTION  Hepatitis B  If you have a higher risk for hepatitis B, you should be screened for this virus. You are considered at high risk for hepatitis B if:  You were born in a country where hepatitis B is common. Ask your health care provider which countries are considered high risk.  Your parents were born in a high-risk country, and you have not been immunized against hepatitis B (hepatitis B vaccine).  You have HIV or AIDS.  You use needles to inject street drugs.  You live with someone who has hepatitis B.  You have had sex with someone who has hepatitis B.  You get hemodialysis treatment.  You take certain medicines for conditions, including cancer, organ transplantation, and autoimmune conditions. Hepatitis C  Blood testing is recommended for:  Everyone born from 1945 through 1965.  Anyone with known risk factors for hepatitis C. Sexually transmitted infections (STIs)  You should be screened for sexually transmitted infections (STIs) including gonorrhea and chlamydia if:  You are sexually active and are younger than 55 years of age.  You are older than 55 years of age and your health care provider tells you that you are at risk for this type of infection.  Your sexual activity has changed since you were last screened and you are at an increased risk for chlamydia or gonorrhea. Ask your health care provider if you are at risk.  If you do not have HIV, but are at risk, it may be recommended that you take a prescription medicine daily to prevent HIV infection. This is called pre-exposure prophylaxis (PrEP). You are considered at risk if:  You are sexually active and do not regularly use condoms or know  the HIV status of your partner(s).  You take drugs by injection.  You are sexually active with a partner who has HIV. Talk with your health care provider about whether you are at high risk of being infected with HIV. If you choose to begin PrEP, you should first be tested for HIV. You should then be tested every 3 months   months for as long as you are taking PrEP.  PREGNANCY   If you are premenopausal and you may become pregnant, ask your health care provider about preconception counseling.  If you may become pregnant, take 400 to 800 micrograms (mcg) of folic acid every day.  If you want to prevent pregnancy, talk to your health care provider about birth control (contraception). OSTEOPOROSIS AND MENOPAUSE   Osteoporosis is a disease in which the bones lose minerals and strength with aging. This can result in serious bone fractures. Your risk for osteoporosis can be identified using a bone density scan.  If you are 24 years of age or older, or if you are at risk for osteoporosis and fractures, ask your health care provider if you should be screened.  Ask your health care provider whether you should take a calcium or vitamin D supplement to lower your risk for osteoporosis.  Menopause may have certain physical symptoms and risks.  Hormone replacement therapy may reduce some of these symptoms and risks. Talk to your health care provider about whether hormone replacement therapy is right for you.  HOME CARE INSTRUCTIONS   Schedule regular health, dental, and eye exams.  Stay current with your immunizations.   Do not use any tobacco products including cigarettes, chewing tobacco, or electronic cigarettes.  If you are pregnant, do not drink alcohol.  If you are breastfeeding, limit how much and how often you drink alcohol.  Limit alcohol intake to no more than 1 drink per day for nonpregnant women. One drink equals 12 ounces of beer, 5 ounces of wine, or 1 ounces of hard liquor.  Do not  use street drugs.  Do not share needles.  Ask your health care provider for help if you need support or information about quitting drugs.  Tell your health care provider if you often feel depressed.  Tell your health care provider if you have ever been abused or do not feel safe at home. Document Released: 10/02/2010 Document Revised: 08/03/2013 Document Reviewed: 02/18/2013 [facility] Patient Information 2015 [place], [place]. This information is not intended to replace advice given to you by your health care provider. Make sure you discuss any questions you have with your health care provider. Smokeless Tobacco Use Smokeless tobacco is a loose, fine, or stringy tobacco. The tobacco is not smoked like a cigarette, but it is chewed or held in the lips or cheeks. It resembles tea and comes from the leaves of the tobacco plant. Smokeless tobacco is usually flavored, sweetened, or processed in some way. Although smokeless tobacco is not smoked into the lungs, its chemicals are absorbed through the membranes in the mouth and into the bloodstream. Its chemicals are also swallowed in saliva. The chemicals (nicotine and other toxins) are known to cause cancer. Smokeless tobacco contains up to 28 differentcarcinogens. CAUSES Nicotine is addictive. Smokeless tobacco contains nicotine, which is a stimulant. This stimulant can give you a "buzz" or altered state. People can become addicted to the feeling it delivers.  SYMPTOMS Smokeless tobacco can cause health problems, including:  Bad breath.  Yellow-brown teeth.  Mouth sores.  Cracking and bleeding lips.  Gum disease, gum recession, and bone loss around the teeth.  Tooth decay.  Increased or irregular heart rate.  High blood pressure, heart disease, and stroke.  Cancer of the mouth, lips, tongue, pancreas, voice box (larynx), esophagus, colon, and bladder.  Precancerous lesion of the soft tissues of the mouth (leukoplakia).  Loss of your  sense of taste.  TREATMENT Talk with your caregiver about ways you can quit. Quitting tobacco is a good decision for your health. Nicotine is addictive, but several options are available to help you quit including:  Nicotine replacement therapy (gum or patch).  Support and cessation programs. The following tips can help you quit:  Write down the reasons you would like to quit and look at them often.  Set a date during a low stress time to stop or cut back.  Ask family and friends for their support.  Remove all tobacco products from your home and work.  Replace the chewing tobacco with things like beef jerky, sunflower seeds, or shredded coconut.  Avoid situations that may make you want to chew tobacco.  Exercise and eat a healthy diet.  When you crave tobacco, distract yourself with drinking water, sugarless chewing gum, sugarless hard candy, exercising, or deep breathing. HOME CARE INSTRUCTIONS  See your dentist for regular oral health exams every 6 months.  Follow up with your caregiver as recommended. SEEK MEDICAL CARE OR DENTAL CARE IF:  You have bleeding or cracking lips, gums, or cheeks.  You have mouth sores, discolorations, or pain.  You have tooth pain.  You develop persistent irritation, burning, or sores in the mouth.  You have pain, tenderness, or numbness in the mouth.  You develop a lump, bumpy patch, or hardened skin inside the mouth.  The color changes inside your mouth (gray, white, or red spots).  You have difficulty chewing, swallowing, or speaking. Document Released: 08/21/2010 Document Revised: 06/11/2011 Document Reviewed: 08/21/2010 [facility] Patient Information 2015 [place], [place]. This information is not intended to replace advice given to you by your health care provider. Make sure you discuss any questions you have with your health care provider. Smoking Cessation, Tips for Success If you are ready to quit smoking, congratulations! You  have chosen to help yourself be healthier. Cigarettes bring nicotine, tar, carbon monoxide, and other irritants into your body. Your lungs, heart, and blood vessels will be able to work better without these poisons. There are many different ways to quit smoking. Nicotine gum, nicotine patches, a nicotine inhaler, or nicotine nasal spray can help with physical craving. Hypnosis, support groups, and medicines help break the habit of smoking. WHAT THINGS CAN I DO TO MAKE QUITTING EASIER?  Here are some tips to help you quit for good:  Pick a date when you will quit smoking completely. Tell all of your friends and family about your plan to quit on that date.  Do not try to slowly cut down on the number of cigarettes you are smoking. Pick a quit date and quit smoking completely starting on that day.  Throw away all cigarettes.   Clean and remove all ashtrays from your home, work, and car.  On a card, write down your reasons for quitting. Carry the card with you and read it when you get the urge to smoke.  Cleanse your body of nicotine. Drink enough water and fluids to keep your urine clear or pale yellow. Do this after quitting to flush the nicotine from your body.  Learn to predict your moods. Do not let a bad situation be your excuse to have a cigarette. Some situations in your life might tempt you into wanting a cigarette.  Never have "just one" cigarette. It leads to wanting another and another. Remind yourself of your decision to quit.  Change habits associated with smoking. If you smoked while driving or when feeling stressed, try other  activities to replace smoking. Stand up when drinking your coffee. Brush your teeth after eating. Sit in a different chair when you read the paper. Avoid alcohol while trying to quit, and try to drink fewer caffeinated beverages. Alcohol and caffeine may urge you to smoke.  Avoid foods and drinks that can trigger a desire to smoke, such as sugary or spicy  foods and alcohol.  Ask people who smoke not to smoke around you.  Have something planned to do right after eating or having a cup of coffee. For example, plan to take a walk or exercise.  Try a relaxation exercise to calm you down and decrease your stress. Remember, you may be tense and nervous for the first 2 weeks after you quit, but this will pass.  Find new activities to keep your hands busy. Play with a pen, coin, or rubber band. Doodle or draw things on paper.  Brush your teeth right after eating. This will help cut down on the craving for the taste of tobacco after meals. You can also try mouthwash.   Use oral substitutes in place of cigarettes. Try using lemon drops, carrots, cinnamon sticks, or chewing gum. Keep them handy so they are available when you have the urge to smoke.  When you have the urge to smoke, try deep breathing.  Designate your home as a nonsmoking area.  If you are a heavy smoker, ask your health care provider about a prescription for nicotine chewing gum. It can ease your withdrawal from nicotine.  Reward yourself. Set aside the cigarette money you save and buy yourself something nice.  Look for support from others. Join a support group or smoking cessation program. Ask someone at home or at work to help you with your plan to quit smoking.  Always ask yourself, "Do I need this cigarette or is this just a reflex?" Tell yourself, "Today, I choose not to smoke," or "I do not want to smoke." You are reminding yourself of your decision to quit.  Do not replace cigarette smoking with electronic cigarettes (commonly called e-cigarettes). The safety of e-cigarettes is unknown, and some may contain harmful chemicals.  If you relapse, do not give up! Plan ahead and think about what you will do the next time you get the urge to smoke. HOW WILL I FEEL WHEN I QUIT SMOKING? You may have symptoms of withdrawal because your body is used to nicotine (the addictive  substance in cigarettes). You may crave cigarettes, be irritable, feel very hungry, cough often, get headaches, or have difficulty concentrating. The withdrawal symptoms are only temporary. They are strongest when you first quit but will go away within 10-14 days. When withdrawal symptoms occur, stay in control. Think about your reasons for quitting. Remind yourself that these are signs that your body is healing and getting used to being without cigarettes. Remember that withdrawal symptoms are easier to treat than the major diseases that smoking can cause.  Even after the withdrawal is over, expect periodic urges to smoke. However, these cravings are generally short lived and will go away whether you smoke or not. Do not smoke! WHAT RESOURCES ARE AVAILABLE TO HELP ME QUIT SMOKING? Your health care provider can direct you to community resources or hospitals for support, which may include:  Group support.  Education.  Hypnosis.  Therapy. Document Released: 12/16/2003 Document Revised: 08/03/2013 Document Reviewed: 09/04/2012 [facility] Patient Information 2015 [place], [place]. This information is not intended to replace advice given to you by your  health care provider. Make sure you discuss any questions you have with your health care provider.

## 2014-10-29 NOTE — Assessment & Plan Note (Signed)
Check TSH today

## 2014-10-29 NOTE — Assessment & Plan Note (Addendum)
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

## 2014-10-30 ENCOUNTER — Other Ambulatory Visit: Payer: Self-pay | Admitting: Family Medicine

## 2014-10-30 DIAGNOSIS — E039 Hypothyroidism, unspecified: Secondary | ICD-10-CM

## 2014-10-30 LAB — HEPATITIS C ANTIBODY: Hep C Virus Ab: <0.1 s/co ratio (ref 0.0–0.9)

## 2014-10-30 LAB — HEPATITIS B SURFACE ANTIBODY,QUALITATIVE: Hep B Surface Ab, Qual: NONREACTIVE

## 2014-10-30 LAB — LIPID PANEL W/O CHOL/HDL RATIO
Cholesterol, Total: 263 mg/dL — ABNORMAL HIGH (ref 100–199)
HDL: 72 mg/dL (ref >39)
LDL CALC: 175 mg/dL — AB (ref 0–99)
Triglycerides: 80 mg/dL (ref 0–149)
VLDL CHOLESTEROL CAL: 16 mg/dL (ref 5–40)

## 2014-10-30 LAB — HEPATITIS B SURFACE ANTIGEN: Hepatitis B Surface Ag: NEGATIVE

## 2014-10-30 LAB — HIV ANTIBODY (ROUTINE TESTING W REFLEX): HIV SCREEN 4TH GENERATION: NONREACTIVE

## 2014-10-30 LAB — TSH: TSH: 0.316 u[IU]/mL — AB (ref 0.450–4.500)

## 2014-10-30 LAB — RPR: RPR: NONREACTIVE

## 2014-10-30 MED ORDER — LEVOTHYROXINE SODIUM 50 MCG PO TABS: ORAL_TABLET | ORAL | Status: DC

## 2014-10-30 NOTE — Assessment & Plan Note (Signed)
TSH low; will slightly decrease the dose and recheck TSH in 8 weeks

## 2014-10-31 DIAGNOSIS — Z789 Other specified health status: Secondary | ICD-10-CM | POA: Insufficient documentation

## 2014-10-31 DIAGNOSIS — Z0000 Encounter for general adult medical examination without abnormal findings: Secondary | ICD-10-CM | POA: Insufficient documentation

## 2014-10-31 DIAGNOSIS — Z1239 Encounter for other screening for malignant neoplasm of breast: Secondary | ICD-10-CM | POA: Insufficient documentation

## 2014-10-31 DIAGNOSIS — Z124 Encounter for screening for malignant neoplasm of cervix: Secondary | ICD-10-CM | POA: Insufficient documentation

## 2014-10-31 DIAGNOSIS — Z1211 Encounter for screening for malignant neoplasm of colon: Secondary | ICD-10-CM | POA: Insufficient documentation

## 2014-10-31 NOTE — Assessment & Plan Note (Signed)
Age-appropriate guidance and screening based on A and B USPSTF recommendations as of April 6837 publication

## 2014-10-31 NOTE — Assessment & Plan Note (Signed)
CBE today; SBE monthly; mammograms every 1-2 years

## 2014-10-31 NOTE — Assessment & Plan Note (Signed)
Warned of flavored cigarettes, vapors, bronchiolitis obliterans; complete cessation would be best

## 2014-10-31 NOTE — Assessment & Plan Note (Signed)
Recommended colonoscopy, should have started at age 54; patient now willing to go

## 2014-10-31 NOTE — Assessment & Plan Note (Signed)
Thin prep collected with HPV co-testing

## 2014-11-01 ENCOUNTER — Encounter: Payer: Self-pay | Admitting: Obstetrics & Gynecology

## 2014-11-04 ENCOUNTER — Encounter: Payer: Self-pay | Admitting: Family Medicine

## 2014-11-04 LAB — PAP LB AND HPV HIGH-RISK: PAP SMEAR COMMENT: 0

## 2014-11-08 ENCOUNTER — Telehealth: Payer: Self-pay

## 2014-11-08 ENCOUNTER — Telehealth: Payer: Self-pay | Admitting: Gastroenterology

## 2014-11-08 ENCOUNTER — Other Ambulatory Visit: Payer: Self-pay

## 2014-11-08 DIAGNOSIS — E785 Hyperlipidemia, unspecified: Secondary | ICD-10-CM

## 2014-11-08 NOTE — Telephone Encounter (Signed)
I talked with patient She does have high cholesterol; eye doctor asked about it Niacin would not help if that was the B vitamin he talked about I'll read about the B vitamins to see if anything can be found about ameliorating aches and pains and call her tomorrow

## 2014-11-08 NOTE — Telephone Encounter (Signed)
Gastroenterology Pre-Procedure Review  Request Date: 11-26-2014 Requesting Physician: Dr. Sanda Klein  PATIENT REVIEW QUESTIONS: The patient responded to the following health history questions as indicated:    1. Are you having any GI issues? no 2. Do you have a personal history of Polyps? no 3. Do you have a family history of Colon Cancer or Polyps? yes (Sister polyps) 4. Diabetes Mellitus? no 5. Joint replacements in the past 12 months?no 6. Major health problems in the past 3 months?no 7. Any artificial heart valves, MVP, or defibrillator?no    MEDICATIONS & ALLERGIES:    Patient reports the following regarding taking any anticoagulation/antiplatelet therapy:   Plavix, Coumadin, Eliquis, Xarelto, Lovenox, Pradaxa, Brilinta, or Effient? no Aspirin? no  Patient confirms/reports the following medications:  Current Outpatient Prescriptions  Medication Sig Dispense Refill   cholecalciferol (VITAMIN D) 1000 UNITS tablet Take 1,000 Units by mouth daily.     estradiol (ESTRACE VAGINAL) 0.1 MG/GM vaginal cream Place 1.61 Applicatorfuls vaginally daily. 42.5 g 12   ibuprofen (ADVIL,MOTRIN) 200 MG tablet Take 800-1,200 mg by mouth 2 (two) times daily as needed. For fever and pain     levothyroxine (SYNTHROID, LEVOTHROID) 50 MCG tablet TAKE ONE TABLET BY MOUTH 3 DAYS PER WEEK. TAKE ONE AND ONE-HALF TABLETS 4 DAYS PER WEEK. 38 tablet 0   levothyroxine (SYNTHROID, LEVOTHROID) 50 MCG tablet One by mouth five days per week, one and one-half pills just two days per week; recheck TSH 8 weeks 32 tablet 1   metroNIDAZOLE (METROGEL VAGINAL) 0.75 % vaginal gel Place 1 Applicatorful vaginally 1 day or 1 dose. 70 g 11   Omega-3 Fatty Acids (FISH OIL PO) Take by mouth 2 (two) times daily.     simvastatin (ZOCOR) 20 MG tablet Take 20 mg by mouth at bedtime.       No current facility-administered medications for this visit.    Patient confirms/reports the following allergies:  No Known Allergies  No  orders of the defined types were placed in this encounter.    AUTHORIZATION INFORMATION Primary Insurance: 1D#: Group #:  Secondary Insurance: 1D#: Group #:  SCHEDULE INFORMATION: Date: 11-26-2014 Time: Location:[facility]

## 2014-11-08 NOTE — Telephone Encounter (Signed)
She went to the eye doctor and he asked if she had high cholesterol. She said yes, but that she didn't take the cholesterol med because it made her achey. He mentioned taking vitamin b with it and that it would help with the aches. Wanted to know if that was true?

## 2014-11-09 MED ORDER — EZETIMIBE 10 MG PO TABS: 10.0000 mg | ORAL_TABLET | Freq: Every day | ORAL | Status: DC

## 2014-11-09 NOTE — Telephone Encounter (Signed)
Please let the patient know that from what I could find, there is no evidence that vitamins prevent muscle aches caused by statins If she is interested, I would suggest the Zetia and then we can recheck her fasting cholesterol in about 3 months I would also recommend that she really limit or avoid saturated fats as much as possible; eat fewer animals, eat more plants

## 2014-11-09 NOTE — Assessment & Plan Note (Signed)
Offer Zetia; recheck lipids in 3 months

## 2014-11-10 NOTE — Telephone Encounter (Signed)
Patient notified

## 2014-11-10 NOTE — Telephone Encounter (Signed)
Super busy ring on cell number, left message to call at home number.

## 2014-11-18 ENCOUNTER — Telehealth: Payer: Self-pay | Admitting: Family Medicine

## 2014-11-18 NOTE — Telephone Encounter (Signed)
Pt called stated she needs a prior authorization for her cholesterol medication. Pharm is Paediatric nurse on [address].

## 2014-11-18 NOTE — Telephone Encounter (Signed)
Routing to [employer] who does prior auths.

## 2014-11-26 ENCOUNTER — Encounter: Payer: Self-pay | Admitting: *Deleted

## 2014-11-26 ENCOUNTER — Telehealth: Payer: Self-pay

## 2014-11-26 NOTE — Telephone Encounter (Signed)
Insurance requires prior auth on Zetia or change to preferred drugs: Atorvastatin or Crestor.

## 2014-11-26 NOTE — Telephone Encounter (Signed)
Please proceed with zetia; she does not tolerate statins, noted in last note

## 2014-11-30 NOTE — Telephone Encounter (Signed)
Laurena Spies prior auth request from pharmacy.

## 2014-11-30 NOTE — Telephone Encounter (Signed)
Approved until 11/30/2015

## 2014-12-01 NOTE — Discharge Instructions (Signed)

## 2014-12-03 ENCOUNTER — Ambulatory Visit
Admission: RE | Admit: 2014-12-03 | Discharge: 2014-12-03 | Disposition: A | Discharge status: Home / Self Care | Payer: BLUE CROSS/BLUE SHIELD | Source: Ambulatory Visit | Attending: Gastroenterology | Admitting: Gastroenterology

## 2014-12-03 ENCOUNTER — Ambulatory Visit: Payer: BLUE CROSS/BLUE SHIELD | Admitting: Anesthesiology

## 2014-12-03 ENCOUNTER — Encounter
Admission: RE | Disposition: A | Discharge status: Home / Self Care | Payer: Self-pay | Source: Ambulatory Visit | Attending: Gastroenterology

## 2014-12-03 ENCOUNTER — Other Ambulatory Visit: Payer: Self-pay | Admitting: Gastroenterology

## 2014-12-03 DIAGNOSIS — Z8489 Family history of other specified conditions: Secondary | ICD-10-CM | POA: Insufficient documentation

## 2014-12-03 DIAGNOSIS — Z8049 Family history of malignant neoplasm of other genital organs: Secondary | ICD-10-CM | POA: Diagnosis not present

## 2014-12-03 DIAGNOSIS — K640 First degree hemorrhoids: Secondary | ICD-10-CM | POA: Diagnosis not present

## 2014-12-03 DIAGNOSIS — Z8249 Family history of ischemic heart disease and other diseases of the circulatory system: Secondary | ICD-10-CM | POA: Insufficient documentation

## 2014-12-03 DIAGNOSIS — K635 Polyp of colon: Secondary | ICD-10-CM | POA: Diagnosis not present

## 2014-12-03 DIAGNOSIS — D125 Benign neoplasm of sigmoid colon: Secondary | ICD-10-CM | POA: Diagnosis not present

## 2014-12-03 DIAGNOSIS — Z9889 Other specified postprocedural states: Secondary | ICD-10-CM | POA: Diagnosis not present

## 2014-12-03 DIAGNOSIS — E039 Hypothyroidism, unspecified: Secondary | ICD-10-CM | POA: Insufficient documentation

## 2014-12-03 DIAGNOSIS — Z7982 Long term (current) use of aspirin: Secondary | ICD-10-CM | POA: Diagnosis not present

## 2014-12-03 DIAGNOSIS — D124 Benign neoplasm of descending colon: Secondary | ICD-10-CM | POA: Insufficient documentation

## 2014-12-03 DIAGNOSIS — E780 Pure hypercholesterolemia: Secondary | ICD-10-CM | POA: Diagnosis not present

## 2014-12-03 DIAGNOSIS — Z825 Family history of asthma and other chronic lower respiratory diseases: Secondary | ICD-10-CM | POA: Insufficient documentation

## 2014-12-03 DIAGNOSIS — Z8051 Family history of malignant neoplasm of kidney: Secondary | ICD-10-CM | POA: Insufficient documentation

## 2014-12-03 DIAGNOSIS — Z833 Family history of diabetes mellitus: Secondary | ICD-10-CM | POA: Insufficient documentation

## 2014-12-03 DIAGNOSIS — Z87891 Personal history of nicotine dependence: Secondary | ICD-10-CM | POA: Insufficient documentation

## 2014-12-03 DIAGNOSIS — Z1211 Encounter for screening for malignant neoplasm of colon: Secondary | ICD-10-CM | POA: Insufficient documentation

## 2014-12-03 HISTORY — PX: COLONOSCOPY WITH PROPOFOL: SHX5780

## 2014-12-03 HISTORY — PX: POLYPECTOMY: SHX5525

## 2014-12-03 HISTORY — DX: Cardiac arrhythmia, unspecified: I49.9

## 2014-12-03 HISTORY — DX: Other specified postprocedural states: Z98.890

## 2014-12-03 HISTORY — DX: Nausea with vomiting, unspecified: R11.2

## 2014-12-03 SURGERY — COLONOSCOPY WITH PROPOFOL
Anesthesia: Monitor Anesthesia Care | Wound class: Contaminated

## 2014-12-03 MED ORDER — PROPOFOL 10 MG/ML IV BOLUS
INTRAVENOUS | Status: DC | PRN
  Administered 2014-12-03: 30 mg via INTRAVENOUS
  Administered 2014-12-03: 20 mg via INTRAVENOUS
  Administered 2014-12-03: 30 mg via INTRAVENOUS
  Administered 2014-12-03: 100 mg via INTRAVENOUS
  Administered 2014-12-03: 20 mg via INTRAVENOUS

## 2014-12-03 MED ORDER — LACTATED RINGERS IV SOLN
INTRAVENOUS | Status: DC
  Administered 2014-12-03 (×2): via INTRAVENOUS

## 2014-12-03 MED ORDER — FENTANYL CITRATE (PF) 100 MCG/2ML IJ SOLN: 25.0000 ug | INTRAMUSCULAR | Status: DC | PRN

## 2014-12-03 MED ORDER — STERILE WATER FOR IRRIGATION IR SOLN
Status: DC | PRN
  Administered 2014-12-03: 10:00:00

## 2014-12-03 MED ORDER — LEVOTHYROXINE SODIUM 50 MCG PO TABS: ORAL_TABLET | ORAL | Status: DC

## 2014-12-03 MED ORDER — OXYCODONE HCL 5 MG/5ML PO SOLN: 5.0000 mg | Freq: Once | ORAL | Status: DC | PRN

## 2014-12-03 MED ORDER — HYDROMORPHONE HCL 1 MG/ML IJ SOLN: 0.2500 mg | INTRAMUSCULAR | Status: DC | PRN

## 2014-12-03 MED ORDER — PROMETHAZINE HCL 25 MG/ML IJ SOLN: 6.2500 mg | INTRAMUSCULAR | Status: DC | PRN

## 2014-12-03 MED ORDER — OXYCODONE HCL 5 MG PO TABS: 5.0000 mg | ORAL_TABLET | Freq: Once | ORAL | Status: DC | PRN

## 2014-12-03 MED ORDER — LIDOCAINE HCL (CARDIAC) 20 MG/ML IV SOLN
INTRAVENOUS | Status: DC | PRN
  Administered 2014-12-03: 30 mg via INTRAVENOUS

## 2014-12-03 SURGICAL SUPPLY — 28 items
CANISTER SUCT 1200ML W/VALVE (MISCELLANEOUS) ×4 IMPLANT
FCP ESCP3.2XJMB 240X2.8X (MISCELLANEOUS)
FORCEPS BIOP RAD 4 LRG CAP 4 (CUTTING FORCEPS) ×2 IMPLANT
FORCEPS BIOP RJ4 240 W/NDL (MISCELLANEOUS)
FORCEPS ESCP3.2XJMB 240X2.8X (MISCELLANEOUS) IMPLANT
GOWN CVR UNV OPN BCK APRN NK (MISCELLANEOUS) ×4 IMPLANT
GOWN ISOL THUMB LOOP REG UNIV (MISCELLANEOUS) ×8
HEMOCLIP INSTINCT (CLIP) IMPLANT
INJECTOR VARIJECT VIN23 (MISCELLANEOUS) IMPLANT
KIT CO2 TUBING (TUBING) IMPLANT
KIT DEFENDO VALVE AND CONN (KITS) IMPLANT
KIT ENDO PROCEDURE OLY (KITS) ×4 IMPLANT
LIGATOR MULTIBAND 6SHOOTER MBL (MISCELLANEOUS) IMPLANT
MARKER SPOT ENDO TATTOO 5ML (MISCELLANEOUS) IMPLANT
PAD GROUND ADULT SPLIT (MISCELLANEOUS) IMPLANT
SNARE SHORT THROW 13M SML OVAL (MISCELLANEOUS) IMPLANT
SNARE SHORT THROW 30M LRG OVAL (MISCELLANEOUS) IMPLANT
SPOT EX ENDOSCOPIC TATTOO (MISCELLANEOUS)
SUCTION POLY TRAP 4CHAMBER (MISCELLANEOUS) IMPLANT
TRAP SUCTION POLY (MISCELLANEOUS) IMPLANT
TUBING CONN 6MMX3.1M (TUBING)
TUBING SUCTION CONN 0.25 STRL (TUBING) IMPLANT
UNDERPAD 30X60 958B10 (PK) (MISCELLANEOUS) IMPLANT
VALVE BIOPSY ENDO (VALVE) IMPLANT
VARIJECT INJECTOR VIN23 (MISCELLANEOUS)
WATER AUXILLARY (MISCELLANEOUS) IMPLANT
WATER STERILE IRR 250ML POUR (IV SOLUTION) ×4 IMPLANT
WATER STERILE IRR 500ML POUR (IV SOLUTION) IMPLANT

## 2014-12-03 NOTE — Anesthesia Postprocedure Evaluation (Signed)
  Anesthesia Post-op Note  Patient: Alejandra Mcdaniel  Procedure(s) Performed: Procedure(s): COLONOSCOPY WITH PROPOFOL (N/A) POLYPECTOMY  Anesthesia type:MAC  Patient location: PACU  Post pain: Pain level controlled  Post assessment: Post-op Vital signs reviewed, Patient's Cardiovascular Status Stable, Respiratory Function Stable, Patent Airway and No signs of Nausea or vomiting  Post vital signs: Reviewed and stable  Last Vitals:  Filed Vitals:   12/03/14 1020  BP: 71/42  Pulse: 58  Temp: 36.2 C  Resp: 19    Level of consciousness: awake, alert  and patient cooperative  Complications: No apparent anesthesia complications

## 2014-12-03 NOTE — Anesthesia Procedure Notes (Signed)
Procedure Name: MAC Performed by: Rafel Garde Pre-anesthesia Checklist: Patient identified, Emergency Drugs available, Suction available, Patient being monitored and Timeout performed Patient Re-evaluated:Patient Re-evaluated prior to inductionOxygen Delivery Method: Nasal cannula Preoxygenation: Pre-oxygenation with 100% oxygen       

## 2014-12-03 NOTE — Anesthesia Preprocedure Evaluation (Signed)
Anesthesia Evaluation  Patient identified by MRN, date of birth, ID band Patient awake    Reviewed: Allergy & Precautions, H&P , NPO status , Patient's Chart, lab work & pertinent test results, reviewed documented beta blocker date and time   Airway Mallampati: II  TM Distance: >3 FB Neck ROM: full    Dental no notable dental hx.    Pulmonary neg pulmonary ROS, former smoker,  breath sounds clear to auscultation  Pulmonary exam normal       Cardiovascular Exercise Tolerance: Good negative cardio ROS  Rhythm:regular Rate:Normal     Neuro/Psych negative neurological ROS  negative psych ROS   GI/Hepatic negative GI ROS, Neg liver ROS,   Endo/Other  negative endocrine ROS  Renal/GU negative Renal ROS  negative genitourinary   Musculoskeletal   Abdominal   Peds  Hematology negative hematology ROS (+)   Anesthesia Other Findings   Reproductive/Obstetrics negative OB ROS                             Anesthesia Physical Anesthesia Plan  ASA: II  Anesthesia Plan: MAC   Post-op Pain Management:    Induction:   Airway Management Planned:   Additional Equipment:   Intra-op Plan:   Post-operative Plan:   Informed Consent: I have reviewed the patients History and Physical, chart, labs and discussed the procedure including the risks, benefits and alternatives for the proposed anesthesia with the patient or authorized representative who has indicated his/her understanding and acceptance.   Dental Advisory Given  Plan Discussed with: CRNA  Anesthesia Plan Comments:         Anesthesia Quick Evaluation

## 2014-12-03 NOTE — Op Note (Signed)
[facility] Patient Name: Alejandra Mcdaniel Procedure Date: 12/03/2014 9:36 AM MRN: [id number] Account #: [id number] Date of Birth: Nov 09, 1960 Admit Type: Outpatient Age: 54 Room: [facility] OR ROOM 01 Gender: Female Note Status: Finalized Procedure:         Colonoscopy Indications:       Screening for colorectal malignant neoplasm Providers:         Lucilla Lame, MD Referring MD:      Halford Chessman (Referring MD) Medicines:         Propofol per Anesthesia Complications:     No immediate complications. Procedure:         Pre-Anesthesia Assessment:                    - Prior to the procedure, a History and Physical was                     performed, and patient medications and allergies were                     reviewed. The patient's tolerance of previous anesthesia                     was also reviewed. The risks and benefits of the procedure                     and the sedation options and risks were discussed with the                     patient. All questions were answered, and informed consent                     was obtained. Prior Anticoagulants: The patient has taken                     no previous anticoagulant or antiplatelet agents. ASA                     Grade Assessment: II - A patient with mild systemic                     disease. After reviewing the risks and benefits, the                     patient was deemed in satisfactory condition to undergo                     the procedure.                    After obtaining informed consent, the colonoscope was                     passed under direct vision. Throughout the procedure, the                     patient's blood pressure, pulse, and oxygen saturations                     were monitored continuously. The Olympus CF-HQ190L                     Colonoscope (S#. [id number]) was introduced through the anus  and advanced to the the cecum, identified by appendiceal                      orifice and ileocecal valve. The colonoscopy was performed                     without difficulty. The patient tolerated the procedure                     well. The quality of the bowel preparation was excellent. Findings:      The perianal and digital rectal examinations were normal.      A 4 mm polyp was found in the descending colon. The polyp was sessile.       The polyp was removed with a cold biopsy forceps. Resection and       retrieval were complete.      Three sessile polyps were found in the sigmoid colon. The polyps were 4       to 6 mm in size. These polyps were removed with a cold biopsy forceps.       Resection and retrieval were complete.      Non-bleeding internal hemorrhoids were found during retroflexion. The       hemorrhoids were Grade I (internal hemorrhoids that do not prolapse). Impression:        - One 4 mm polyp in the descending colon. Resected and                     retrieved.                    - Three 4 to 6 mm polyps in the sigmoid colon. Resected                     and retrieved.                    - Non-bleeding internal hemorrhoids. Recommendation:    - Repeat colonoscopy in 5 years if polyp adenoma and 10                     years if hyperplastic Procedure Code(s): --- Professional ---                    [phone], Colonoscopy, flexible; with biopsy, single or                     multiple Diagnosis Code(s): --- Professional ---                    Z12.11, Encounter for screening for malignant neoplasm of                     colon                    D12.4, Benign neoplasm of descending colon                    D12.5, Benign neoplasm of sigmoid colon CPT copyright 2014 American Medical Association. All rights reserved. The codes documented in this report are preliminary and upon coder review may  be revised to meet current compliance requirements. Lucilla Lame, MD 12/03/2014 10:12:53 AM This report has been signed electronically. Number of Addenda:  0 Note Initiated On: 12/03/2014 9:36 AM Scope Withdrawal Time: 0 hours 6 minutes  42 seconds  Total Procedure Duration: 0 hours 10 minutes 24 seconds       [facility]

## 2014-12-03 NOTE — Transfer of Care (Signed)
Immediate Anesthesia Transfer of Care Note  Patient: Alejandra Mcdaniel  Procedure(s) Performed: Procedure(s): COLONOSCOPY WITH PROPOFOL (N/A)  Patient Location: PACU  Anesthesia Type: MAC  Level of Consciousness: awake, alert  and patient cooperative  Airway and Oxygen Therapy: Patient Spontanous Breathing and Patient connected to supplemental oxygen  Post-op Assessment: Post-op Vital signs reviewed, Patient's Cardiovascular Status Stable, Respiratory Function Stable, Patent Airway and No signs of Nausea or vomiting  Post-op Vital Signs: Reviewed and stable  Complications: No apparent anesthesia complications

## 2014-12-03 NOTE — H&P (Signed)
[facility]  [address] [place], [place] [address] Phone: [phone] Fax : [phone]  Primary Care Physician:  Enid Derry, MD Primary Gastroenterologist:  Dr. Allen Norris  Pre-Procedure History & Physical: HPI:  Alejandra Mcdaniel is a 54 y.o. female is here for a screening colonoscopy.   Past Medical History  Diagnosis Date  . Hypercholesteremia   . Hyperlipidemia   . Hypothyroidism   . PONV (postoperative nausea and vomiting)   . Dysrhythmia     occasional palpatations (secondary to thyroid disease)    Past Surgical History  Procedure Laterality Date  . Cesarean section  x2  . Tonsillectomy      Prior to Admission medications   Medication Sig Start Date End Date Taking? Authorizing Provider  aspirin 81 MG tablet Take 81 mg by mouth daily.   Yes Historical Provider, MD  ibuprofen (ADVIL,MOTRIN) 200 MG tablet Take 800-1,200 mg by mouth 2 (two) times daily as needed. For fever and pain   Yes Historical Provider, MD  levothyroxine (SYNTHROID, LEVOTHROID) 50 MCG tablet One by mouth five days per week, one and one-half pills just two days per week; recheck TSH 8 weeks 10/30/14  Yes Arnetha Courser, MD  Omega-3 Fatty Acids (FISH OIL PO) Take by mouth 2 (two) times daily.   Yes Historical Provider, MD  cholecalciferol (VITAMIN D) 1000 UNITS tablet Take 1,000 Units by mouth daily.    Historical Provider, MD  estradiol (ESTRACE VAGINAL) 0.1 MG/GM vaginal cream Place 7.04 Applicatorfuls vaginally daily. 11/10/12   Florian Buff, MD  ezetimibe (ZETIA) 10 MG tablet Take 1 tablet (10 mg total) by mouth daily. Patient not taking: Reported on 12/03/2014 11/09/14   Arnetha Courser, MD  levothyroxine (SYNTHROID, LEVOTHROID) 50 MCG tablet TAKE ONE TABLET BY MOUTH 3 DAYS PER WEEK. TAKE ONE AND ONE-HALF TABLETS 4 DAYS PER WEEK. 10/11/14   Arnetha Courser, MD  metroNIDAZOLE (METROGEL VAGINAL) 0.75 % vaginal gel Place 1 Applicatorful vaginally 1 day or 1 dose. 11/10/12   Florian Buff,  MD    Allergies as of 11/08/2014  . (No Known Allergies)    Family History  Problem Relation Age of Onset  . Cancer Mother     cervical  . Heart disease Mother   . Diabetes Mother   . Hyperlipidemia Mother   . Heart disease Father   . Diabetes Father   . Hyperlipidemia Father   . Thyroid disease Father   . Diabetes Sister   . COPD Sister   . Cancer Brother     kidney  . Diabetes Brother   . Hypertension Brother     Social History   Social History  . Marital Status: Single    Spouse Name: N/A  . Number of Children: N/A  . Years of Education: N/A   Occupational History  . Not on file.   Social History Main Topics  . Smoking status: Former Smoker -- 2.00 packs/day for 40 years    Types: Cigarettes, E-cigarettes    Quit date: 12/01/2012  . Smokeless tobacco: Never Used     Comment: uses Vapor inhaler  . Alcohol Use: No  . Drug Use: No  . Sexual Activity: Yes   Other Topics Concern  . Not on file   Social History Narrative   ** Merged History Encounter **        Review of Systems: See HPI, otherwise negative ROS  Physical Exam: BP 91/66 mmHg  Pulse 59  Temp(Src)  97.7 F (36.5 C) (Tympanic)  Resp 16  Ht 5\' 2"  (1.575 m)  Wt 126 lb (57.153 kg)  BMI 23.04 kg/m2  SpO2 100% General:   Alert,  pleasant and cooperative in NAD Head:  Normocephalic and atraumatic. Neck:  Supple; no masses or thyromegaly. Lungs:  Clear throughout to auscultation.    Heart:  Regular rate and rhythm. Abdomen:  Soft, nontender and nondistended. Normal bowel sounds, without guarding, and without rebound.   Neurologic:  Alert and  oriented x4;  grossly normal neurologically.  Impression/Plan: Alejandra Mcdaniel is now here to undergo a screening colonoscopy.  Risks, benefits, and alternatives regarding colonoscopy have been reviewed with the patient.  Questions have been answered.  All parties agreeable.

## 2014-12-07 ENCOUNTER — Encounter: Payer: Self-pay | Admitting: Gastroenterology

## 2014-12-08 ENCOUNTER — Encounter: Payer: Self-pay | Admitting: Gastroenterology

## 2014-12-21 ENCOUNTER — Telehealth: Payer: Self-pay

## 2014-12-21 NOTE — Telephone Encounter (Signed)
-----   Message from Arnetha Courser, MD sent at 12/18/2014 12:12 PM EDT ----- Regarding: remind patient to come in for fasting labs She should have fasting labs done after she has been on the zetia 8-12 weeks  ----- Message -----    From: SYSTEM    Sent: 12/06/2014  12:04 AM      To: Arnetha Courser, MD

## 2014-12-21 NOTE — Telephone Encounter (Signed)
I talked with patient She does not want statins, and can't afford the Zetia I talked with patient about making dietary changes; she'll try less saturated fats, more fiber (25+ grams a day) Recheck in 3-4 months

## 2014-12-21 NOTE — Telephone Encounter (Signed)
Left message to call.

## 2014-12-21 NOTE — Telephone Encounter (Signed)
Patient did not start taking it, it was $95 for her part. She wants to know if there is something else you can change it to.

## 2014-12-23 ENCOUNTER — Telehealth: Payer: Self-pay

## 2014-12-23 NOTE — Telephone Encounter (Signed)
Patient notified

## 2014-12-23 NOTE — Telephone Encounter (Signed)
-----   Message from Arnetha Courser, MD sent at 12/22/2014  8:16 PM EDT ----- Regarding: Patient is due for TSH check please  Due for TSH recheck soon please; thank you  ----- Message -----    From: SYSTEM    Sent: 12/06/2014  12:04 AM      To: Arnetha Courser, MD

## 2014-12-24 ENCOUNTER — Other Ambulatory Visit: Payer: BLUE CROSS/BLUE SHIELD

## 2014-12-24 DIAGNOSIS — E039 Hypothyroidism, unspecified: Secondary | ICD-10-CM

## 2014-12-25 LAB — TSH: TSH: 2.65 u[IU]/mL (ref 0.450–4.500)

## 2014-12-29 ENCOUNTER — Telehealth: Payer: Self-pay | Admitting: Family Medicine

## 2014-12-29 MED ORDER — LEVOTHYROXINE SODIUM 50 MCG PO TABS: ORAL_TABLET | ORAL | Status: DC

## 2014-12-29 NOTE — Telephone Encounter (Signed)
Patient notified, confirmed directions with her. They are correct. She would like rx to be changed to a 90 day supply thou.

## 2014-12-29 NOTE — Telephone Encounter (Signed)
Pt would like a call back

## 2014-12-29 NOTE — Telephone Encounter (Signed)
Please let pt know thyroid test came back in normal range on the new schedule; I sent in new Rx; verify instructions with her please, as outside off put in duplicate Rx; I took that out, but want to make sure how she's taking it

## 2014-12-29 NOTE — Telephone Encounter (Signed)
Left message to call.

## 2015-01-03 ENCOUNTER — Telehealth: Payer: Self-pay | Admitting: Family Medicine

## 2015-01-03 NOTE — Telephone Encounter (Signed)
This encounter was created in error - please disregard.

## 2015-01-03 NOTE — Telephone Encounter (Signed)
Mailing information from [email or web address] on cholesterol

## 2015-01-24 ENCOUNTER — Ambulatory Visit (INDEPENDENT_AMBULATORY_CARE_PROVIDER_SITE_OTHER): Payer: BLUE CROSS/BLUE SHIELD | Admitting: Family Medicine

## 2015-01-24 ENCOUNTER — Encounter: Payer: Self-pay | Admitting: Family Medicine

## 2015-01-24 VITALS — BP 104/71 | HR 88 | Temp 98.2°F | Ht 63.0 in | Wt 132.0 lb

## 2015-01-24 DIAGNOSIS — J069 Acute upper respiratory infection, unspecified: Secondary | ICD-10-CM

## 2015-01-24 DIAGNOSIS — Z5181 Encounter for therapeutic drug level monitoring: Secondary | ICD-10-CM

## 2015-01-24 DIAGNOSIS — E785 Hyperlipidemia, unspecified: Secondary | ICD-10-CM | POA: Diagnosis not present

## 2015-01-24 MED ORDER — ATORVASTATIN CALCIUM 10 MG PO TABS: 10.0000 mg | ORAL_TABLET | Freq: Every day | ORAL | Status: DC

## 2015-01-24 NOTE — Assessment & Plan Note (Signed)
Antibiotics not indicated; rest, hydration, vitamin C, green tea; watch for secondary infection; out of work today and tomorrow

## 2015-01-24 NOTE — Assessment & Plan Note (Signed)
Check sgpt in 12 weeks on statin

## 2015-01-24 NOTE — Progress Notes (Signed)
BP 104/71 mmHg  Pulse 88  Temp(Src) 98.2 F (36.8 C)  Ht 5\' 3"  (1.6 m)  Wt 132 lb (59.875 kg)  BMI 23.39 kg/m2  SpO2 99%   Subjective:    Patient ID: Alejandra Mcdaniel, female    DOB: 1961-02-13, 54 y.o.   MRN: [id number]  HPI: Alejandra Mcdaniel is a 54 y.o. female  Chief Complaint  Patient presents with  . URI    ears and throat hurts, sore throat, head congestion, thinks she had a fever last night. Started yesterday.   "I haven't felt this bad in so long" "My head is busting, my throat is sore, I'm congested one minute and nose running like the crazy the next" Having body aches; feels like flu; needs note for work today; just little bit of cough, every now and the No rash; no facial pain; ears bothering her; no swollen glands in the neck Has not tried any medicines She got sick yesterday; had fever last night; was under the covers last night No travel anywhere; no visits to hospitals, nursing homes, or daycares, but did go to the [employer] on Saturday  She is willing to take low dose statin for her high cholesterol; has been on them before but really hurt; her cardiologist told her to stop and then restart later  Relevant past medical, surgical, family and social history reviewed and updated as indicated. Interim medical history since our last visit reviewed. Allergies and medications reviewed and updated.  Review of Systems Per HPI unless specifically indicated above     Objective:    BP 104/71 mmHg  Pulse 88  Temp(Src) 98.2 F (36.8 C)  Ht 5\' 3"  (1.6 m)  Wt 132 lb (59.875 kg)  BMI 23.39 kg/m2  SpO2 99%  Wt Readings from Last 3 Encounters:  01/24/15 132 lb (59.875 kg)  12/03/14 126 lb (57.153 kg)  10/29/14 133 lb (60.328 kg)    Physical Exam  Constitutional: She appears well-developed and well-nourished. No distress.  HENT:  Right Ear: Hearing, tympanic membrane, external ear and ear canal normal. No middle ear effusion.  Left Ear: Hearing, tympanic  membrane, external ear and ear canal normal.  No middle ear effusion.  Nose: Rhinorrhea (scant, clear, patient sniffling from time to time) present. No mucosal edema.  Mouth/Throat: Mucous membranes are normal. No oropharyngeal exudate, posterior oropharyngeal edema or tonsillar abscesses. Posterior oropharyngeal erythema: mildly injected posterior OP.  Eyes: Right eye exhibits no discharge. Left eye exhibits no discharge.  Cardiovascular: Normal rate and regular rhythm.   Pulmonary/Chest: Effort normal and breath sounds normal. She has no decreased breath sounds. She has no wheezes. She has no rhonchi.  Lymphadenopathy:    She has no cervical adenopathy.  Skin: No rash noted. She is not diaphoretic.    Results for orders placed or performed in visit on 12/24/14  TSH  Result Value Ref Range   TSH 2.650 0.450 - 4.500 uIU/mL      Assessment & Plan:   Problem List Items Addressed This Visit      Respiratory   Viral upper respiratory infection - Primary    Antibiotics not indicated; rest, hydration, vitamin C, green tea; watch for secondary infection; out of work today and tomorrow        Other   Encounter for medication monitoring    Check sgpt in 12 weeks on statin      Relevant Orders   ALT (SGPT) Piccolo, Waived   Hyperlipidemia  Start back on statin, lower dose than previously; decrease further to every other day if needed, or stop completely and call me if a problem; she agrees; avoid saturated fats too      Relevant Medications   atorvastatin (LIPITOR) 10 MG tablet   Other Relevant Orders   Lipid Panel Piccolo, Waived      An after-visit summary was printed and given to the patient at [place].  Please see the patient instructions which may contain other information and recommendations beyond what is mentioned above in the assessment and plan.  Follow up plan: No Follow-up on file.   Orders Placed This Encounter  Procedures  . ALT (SGPT) Piccolo, [place]  .  Lipid Panel Piccolo, [place]  These should be done in 57 WEEKS, not in-house

## 2015-01-24 NOTE — Assessment & Plan Note (Signed)
Start back on statin, lower dose than previously; decrease further to every other day if needed, or stop completely and call me if a problem; she agrees; avoid saturated fats too

## 2015-01-24 NOTE — Patient Instructions (Addendum)
Out of work note for today and tomorrow Try vitamin C (orange juice if not diabetic or vitamin C tablets) and drink green tea to help your immune system during your illness Get plenty of rest and hydration Avoid saturated fats Start back on the atrovastatin; if muscle aches develop, then take every other night; if still bothering you, stop and call me Recheck lipids and sgpt in 12 weeks Do try to quit smoking  Upper Respiratory Infection, Adult Most upper respiratory infections (URIs) are a viral infection of the air passages leading to the lungs. A URI affects the nose, throat, and upper air passages. The most common type of URI is nasopharyngitis and is typically referred to as "the common cold." URIs run their course and usually go away on their own. Most of the time, a URI does not require medical attention, but sometimes a bacterial infection in the upper airways can follow a viral infection. This is called a secondary infection. Sinus and middle ear infections are common types of secondary upper respiratory infections. Bacterial pneumonia can also complicate a URI. A URI can worsen asthma and chronic obstructive pulmonary disease (COPD). Sometimes, these complications can require emergency medical care and may be life threatening.  CAUSES Almost all URIs are caused by viruses. A virus is a type of germ and can spread from one person to another.  RISKS FACTORS You may be at risk for a URI if:   You smoke.   You have chronic heart or lung disease.  You have a weakened defense (immune) system.   You are very young or very old.   You have nasal allergies or asthma.  You work in crowded or poorly ventilated areas.  You work in health care facilities or schools. SIGNS AND SYMPTOMS  Symptoms typically develop 2-3 days after you come in contact with a cold virus. Most viral URIs last 7-10 days. However, viral URIs from the influenza virus (flu virus) can last 14-18 days and are  typically more severe. Symptoms may include:   Runny or stuffy (congested) nose.   Sneezing.   Cough.   Sore throat.   Headache.   Fatigue.   Fever.   Loss of appetite.   Pain in your forehead, behind your eyes, and over your cheekbones (sinus pain).  Muscle aches.  DIAGNOSIS  Your health care provider may diagnose a URI by:  Physical exam.  Tests to check that your symptoms are not due to another condition such as:  Strep throat.  Sinusitis.  Pneumonia.  Asthma. TREATMENT  A URI goes away on its own with time. It cannot be cured with medicines, but medicines may be prescribed or recommended to relieve symptoms. Medicines may help:  Reduce your fever.  Reduce your cough.  Relieve nasal congestion. HOME CARE INSTRUCTIONS   Take medicines only as directed by your health care provider.   Gargle warm saltwater or take cough drops to comfort your throat as directed by your health care provider.  Use a warm mist humidifier or inhale steam from a shower to increase air moisture. This may make it easier to breathe.  Drink enough fluid to keep your urine clear or pale yellow.   Eat soups and other clear broths and maintain good nutrition.   Rest as needed.   Return to work when your temperature has returned to normal or as your health care provider advises. You may need to stay home longer to avoid infecting others. You can also use a  face mask and careful hand washing to prevent spread of the virus.  Increase the usage of your inhaler if you have asthma.   Do not use any tobacco products, including cigarettes, chewing tobacco, or electronic cigarettes. If you need help quitting, ask your health care provider. PREVENTION  The best way to protect yourself from getting a cold is to practice good hygiene.   Avoid oral or hand contact with people with cold symptoms.   Wash your hands often if contact occurs.  There is no clear evidence that  vitamin C, vitamin E, echinacea, or exercise reduces the chance of developing a cold. However, it is always recommended to get plenty of rest, exercise, and practice good nutrition.  SEEK MEDICAL CARE IF:   You are getting worse rather than better.   Your symptoms are not controlled by medicine.   You have chills.  You have worsening shortness of breath.  You have brown or red mucus.  You have yellow or brown nasal discharge.  You have pain in your face, especially when you bend forward.  You have a fever.  You have swollen neck glands.  You have pain while swallowing.  You have white areas in the back of your throat. SEEK IMMEDIATE MEDICAL CARE IF:   You have severe or persistent:  Headache.  Ear pain.  Sinus pain.  Chest pain.  You have chronic lung disease and any of the following:  Wheezing.  Prolonged cough.  Coughing up blood.  A change in your usual mucus.  You have a stiff neck.  You have changes in your:  Vision.  Hearing.  Thinking.  Mood. MAKE SURE YOU:   Understand these instructions.  Will watch your condition.  Will get help right away if you are not doing well or get worse.   This information is not intended to replace advice given to you by your health care provider. Make sure you discuss any questions you have with your health care provider.   Document Released: 09/12/2000 Document Revised: 08/03/2014 Document Reviewed: 06/24/2013 Elsevier Interactive Patient Education 2016 [employer]. Smoking Cessation, Tips for Success If you are ready to quit smoking, congratulations! You have chosen to help yourself be healthier. Cigarettes bring nicotine, tar, carbon monoxide, and other irritants into your body. Your lungs, heart, and blood vessels will be able to work better without these poisons. There are many different ways to quit smoking. Nicotine gum, nicotine patches, a nicotine inhaler, or nicotine nasal spray can help with  physical craving. Hypnosis, support groups, and medicines help break the habit of smoking. WHAT THINGS CAN I DO TO MAKE QUITTING EASIER?  Here are some tips to help you quit for good:  Pick a date when you will quit smoking completely. Tell all of your friends and family about your plan to quit on that date.  Do not try to slowly cut down on the number of cigarettes you are smoking. Pick a quit date and quit smoking completely starting on that day.  Throw away all cigarettes.   Clean and remove all ashtrays from your home, work, and car.  On a card, write down your reasons for quitting. Carry the card with you and read it when you get the urge to smoke.  Cleanse your body of nicotine. Drink enough water and fluids to keep your urine clear or pale yellow. Do this after quitting to flush the nicotine from your body.  Learn to predict your moods. Do not let a  bad situation be your excuse to have a cigarette. Some situations in your life might tempt you into wanting a cigarette.  Never have "just one" cigarette. It leads to wanting another and another. Remind yourself of your decision to quit.  Change habits associated with smoking. If you smoked while driving or when feeling stressed, try other activities to replace smoking. Stand up when drinking your coffee. Brush your teeth after eating. Sit in a different chair when you read the paper. Avoid alcohol while trying to quit, and try to drink fewer caffeinated beverages. Alcohol and caffeine may urge you to smoke.  Avoid foods and drinks that can trigger a desire to smoke, such as sugary or spicy foods and alcohol.  Ask people who smoke not to smoke around you.  Have something planned to do right after eating or having a cup of coffee. For example, plan to take a walk or exercise.  Try a relaxation exercise to calm you down and decrease your stress. Remember, you may be tense and nervous for the first 2 weeks after you quit, but this will  pass.  Find new activities to keep your hands busy. Play with a pen, coin, or rubber band. Doodle or draw things on paper.  Brush your teeth right after eating. This will help cut down on the craving for the taste of tobacco after meals. You can also try mouthwash.   Use oral substitutes in place of cigarettes. Try using lemon drops, carrots, cinnamon sticks, or chewing gum. Keep them handy so they are available when you have the urge to smoke.  When you have the urge to smoke, try deep breathing.  Designate your home as a nonsmoking area.  If you are a heavy smoker, ask your health care provider about a prescription for nicotine chewing gum. It can ease your withdrawal from nicotine.  Reward yourself. Set aside the cigarette money you save and buy yourself something nice.  Look for support from others. Join a support group or smoking cessation program. Ask someone at home or at work to help you with your plan to quit smoking.  Always ask yourself, "Do I need this cigarette or is this just a reflex?" Tell yourself, "Today, I choose not to smoke," or "I do not want to smoke." You are reminding yourself of your decision to quit.  Do not replace cigarette smoking with electronic cigarettes (commonly called e-cigarettes). The safety of e-cigarettes is unknown, and some may contain harmful chemicals.  If you relapse, do not give up! Plan ahead and think about what you will do the next time you get the urge to smoke. HOW WILL I FEEL WHEN I QUIT SMOKING? You may have symptoms of withdrawal because your body is used to nicotine (the addictive substance in cigarettes). You may crave cigarettes, be irritable, feel very hungry, cough often, get headaches, or have difficulty concentrating. The withdrawal symptoms are only temporary. They are strongest when you first quit but will go away within 10-14 days. When withdrawal symptoms occur, stay in control. Think about your reasons for quitting. Remind  yourself that these are signs that your body is healing and getting used to being without cigarettes. Remember that withdrawal symptoms are easier to treat than the major diseases that smoking can cause.  Even after the withdrawal is over, expect periodic urges to smoke. However, these cravings are generally short lived and will go away whether you smoke or not. Do not smoke! WHAT RESOURCES ARE AVAILABLE TO  HELP ME QUIT SMOKING? Your health care provider can direct you to community resources or hospitals for support, which may include:  Group support.  Education.  Hypnosis.  Therapy.   This information is not intended to replace advice given to you by your health care provider. Make sure you discuss any questions you have with your health care provider.   Document Released: 12/16/2003 Document Revised: 04/09/2014 Document Reviewed: 09/04/2012 Elsevier Interactive Patient Education [employer].

## 2015-01-27 ENCOUNTER — Ambulatory Visit: Payer: BLUE CROSS/BLUE SHIELD | Admitting: Family Medicine

## 2015-04-27 ENCOUNTER — Ambulatory Visit: Payer: BLUE CROSS/BLUE SHIELD | Admitting: Family Medicine

## 2015-04-29 ENCOUNTER — Encounter: Payer: Self-pay | Admitting: Family Medicine

## 2015-04-29 ENCOUNTER — Ambulatory Visit (INDEPENDENT_AMBULATORY_CARE_PROVIDER_SITE_OTHER): Payer: BLUE CROSS/BLUE SHIELD | Admitting: Family Medicine

## 2015-04-29 VITALS — BP 108/71 | HR 77 | Temp 98.5°F | Wt 141.0 lb

## 2015-04-29 DIAGNOSIS — E785 Hyperlipidemia, unspecified: Secondary | ICD-10-CM

## 2015-04-29 DIAGNOSIS — E039 Hypothyroidism, unspecified: Secondary | ICD-10-CM | POA: Diagnosis not present

## 2015-04-29 DIAGNOSIS — Z5181 Encounter for therapeutic drug level monitoring: Secondary | ICD-10-CM

## 2015-04-29 DIAGNOSIS — J069 Acute upper respiratory infection, unspecified: Secondary | ICD-10-CM | POA: Diagnosis not present

## 2015-04-29 DIAGNOSIS — Z1239 Encounter for other screening for malignant neoplasm of breast: Secondary | ICD-10-CM

## 2015-04-29 DIAGNOSIS — R635 Abnormal weight gain: Secondary | ICD-10-CM

## 2015-04-29 NOTE — Patient Instructions (Addendum)
GINA--> please get doctor's note for today's appt Try to eat a little something in the morning to get those fires burning and get your metabolism going Try to get 64 ounces of water a day If the weight gain continues despite your best efforts, then let me know in 4-6 weeks and we will recheck your thyroid Return on or after October 30, 2015 for next physical Try to limit saturated fats in your diet (bologna, hot dogs, barbeque, cheeseburgers, hamburgers, steak, bacon, sausage, cheese, etc.) and get more fresh fruits, vegetables, and whole grains  Cholesterol Cholesterol is a white, waxy, fat-like substance needed by your body in small amounts. The liver makes all the cholesterol you need. Cholesterol is carried from the liver by the blood through the blood vessels. Deposits of cholesterol (plaque) may build up on blood vessel walls. These make the arteries narrower and stiffer. Cholesterol plaques increase the risk for heart attack and stroke.  You cannot feel your cholesterol level even if it is very high. The only way to know it is high is with a blood test. Once you know your cholesterol levels, you should keep a record of the test results. Work with your health care provider to keep your levels in the desired range.  WHAT DO THE RESULTS MEAN?  Total cholesterol is a rough measure of all the cholesterol in your blood.   LDL is the so-called bad cholesterol. This is the type that deposits cholesterol in the walls of the arteries. You want this level to be low.   HDL is the good cholesterol because it cleans the arteries and carries the LDL away. You want this level to be high.  Triglycerides are fat that the body can either burn for energy or store. High levels are closely linked to heart disease.  WHAT ARE THE DESIRED LEVELS OF CHOLESTEROL?  Total cholesterol below 200.   LDL below 100 for people at risk, below 70 for those at very high risk.   HDL above 50 is good, above 60 is best.    Triglycerides below 150.  HOW CAN I LOWER MY CHOLESTEROL?  Diet. Follow your diet programs as directed by your health care provider.   Choose fish or white meat chicken and [place], roasted or baked. Limit fatty cuts of red meat, fried foods, and processed meats, such as sausage and lunch meats.   Eat lots of fresh fruits and vegetables.  Choose whole grains, beans, pasta, potatoes, and cereals.   Use only small amounts of olive, corn, or canola oils.   Avoid butter, mayonnaise, shortening, or palm kernel oils.  Avoid foods with trans fats.   Drink skim or nonfat milk and eat low-fat or nonfat yogurt and cheeses. Avoid whole milk, cream, ice cream, egg yolks, and full-fat cheeses.   Healthy desserts include angel food cake, ginger snaps, animal crackers, hard candy, popsicles, and low-fat or nonfat frozen yogurt. Avoid pastries, cakes, pies, and cookies.   Exercise. Follow your exercise programs as directed by your health care provider.   A regular program helps decrease LDL and raise HDL.   A regular program helps with weight control.   Do things that increase your activity level like gardening, walking, or taking the stairs. Ask your health care provider about how you can be more active in your daily life.   Medicine. Take medicine only as directed by your health care provider.   Medicine may be prescribed by your health care provider to help lower cholesterol  and decrease the risk for heart disease.   If you have several risk factors, you may need medicine even if your levels are normal.   This information is not intended to replace advice given to you by your health care provider. Make sure you discuss any questions you have with your health care provider.   Document Released: 12/12/2000 Document Revised: 04/09/2014 Document Reviewed: 12/31/2012 Elsevier Interactive Patient Education [employer].

## 2015-04-29 NOTE — Assessment & Plan Note (Addendum)
Fasting labs drawn today; continue to work on healthier eating, continue statin; try to lose weight (but see note in hypothyroid section, will check TSH if weight gain continues despite efforts at healthier eating and weight loss); goal LDL under 130 at least, under 100 would be ideal; decrease saturated fats

## 2015-04-29 NOTE — Progress Notes (Signed)
BP 108/71 mmHg  Pulse 77  Temp(Src) 98.5 F (36.9 C)  Wt 141 lb (63.957 kg)  SpO2 97%   Subjective:    Patient ID: Alejandra Mcdaniel, female    DOB: 04/08/1960, 55 y.o.   MRN: [id number]  HPI: Alejandra Mcdaniel is a 55 y.o. female  Chief Complaint  Patient presents with  . Hyperlipidemia    routine follow up and labs  . Hypothyroidism    routine follow up and labs  . Referral    reminded patient to call and schedule mammo   She had a health screen in December and her cholesterol was perfect; she does not eat breakfast, lunch is a bag of chips; supper is whatever, never knows what time she gets off work; when she gets up, takes thyroid medicine and then she is out the door to go to work; she does not want to eat for an hour after taking her medicine; she does get a mid-morning break and is open to trying to eat something then  She had normal TSH a few months ago; she thinks the weight gain is from holidays and extra desserts; no constipation; energy level -- stays tired; working too much; eating and going to bed; works for [employer], work comes in spurts, 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  Review of Systems  Constitutional: Positive for unexpected weight change.  Gastrointestinal: Negative for constipation.  Per HPI unless specifically indicated above     Objective:    BP 108/71 mmHg  Pulse 77  Temp(Src) 98.5 F (36.9 C)  Wt 141 lb (63.957 kg)  SpO2 97%  Wt Readings from Last 3 Encounters:  04/29/15 141 lb (63.957 kg)   01/24/15 132 lb (59.875 kg)  12/03/14 126 lb (57.153 kg)    Physical Exam  Constitutional: She appears well-developed and well-nourished. No distress.  Weight gain 9 pounds over last 2 months  Eyes: No scleral icterus.  Neck: No thyromegaly present.  Cardiovascular: Normal rate and regular rhythm.   Pulmonary/Chest: Effort normal and breath sounds normal.  Abdominal: She exhibits no distension.  Musculoskeletal: She exhibits no edema.  Neurological: She is alert.  Skin: Skin is warm. No pallor.  Psychiatric: She has a normal mood and affect.      Assessment & Plan:   Problem List Items Addressed This Visit      Respiratory   Viral upper respiratory infection    resolved        Endocrine   Hypothyroidism    Last thyroid tests reviewed; she has gained weight, over holiday season, no constipation; if weight gain continues despite efforts at eating better, then we'll recheck TSH in the coming weeks        Other   Encounter for medication monitoring    Monitor SGPT on the statin      Relevant Orders   ALT (Completed)   Lipid Panel w/o Chol/HDL Ratio (Completed)   Hyperlipidemia - Primary    Fasting labs drawn today; continue to work on  healthier eating, continue statin; try to lose weight (but see note in hypothyroid section, will check TSH if weight gain continues despite efforts at healthier eating and weight loss); goal LDL under 130 at least, under 100 would be ideal; decrease saturated fats       Relevant Orders   ALT (Completed)   Lipid Panel w/o Chol/HDL Ratio (Completed)   Breast cancer screening    Patient agrees to call and schedule a mammogram       Other Visit Diagnoses    Abnormal weight gain        9 pounds over last 2 months; discussed with patient; last thyroid test reviewed; may be holidays; recheck TSH if wt gain persists       Follow up plan: Return on or after October 30, 2015, for complete physical.  Orders Placed This Encounter   Procedures  . ALT  . Lipid Panel w/o Chol/HDL Ratio   Refilled statin at same dose, 30+6  An after-visit summary was printed and given to the patient at [facility].  Please see the patient instructions which may contain other information and recommendations beyond what is mentioned above in the assessment and plan.

## 2015-04-29 NOTE — Assessment & Plan Note (Addendum)
Monitor SGPT on the statin 

## 2015-04-30 ENCOUNTER — Telehealth: Payer: Self-pay | Admitting: Family Medicine

## 2015-04-30 LAB — LIPID PANEL W/O CHOL/HDL RATIO
CHOLESTEROL TOTAL: 180 mg/dL (ref 100–199)
HDL: 62 mg/dL (ref >39)
LDL CALC: 95 mg/dL (ref 0–99)
TRIGLYCERIDES: 114 mg/dL (ref 0–149)
VLDL CHOLESTEROL CAL: 23 mg/dL (ref 5–40)

## 2015-04-30 LAB — ALT: ALT: 18 IU/L (ref 0–32)

## 2015-04-30 MED ORDER — ATORVASTATIN CALCIUM 10 MG PO TABS: 10.0000 mg | ORAL_TABLET | Freq: Every day | ORAL | Status: DC

## 2015-04-30 NOTE — Telephone Encounter (Signed)
I left msg, labs are fantastic; I am so pleased; I sent refills of medicine in to pharm; call on Monday with any questions

## 2015-04-30 NOTE — Assessment & Plan Note (Signed)
resolved 

## 2015-04-30 NOTE — Telephone Encounter (Signed)
She has had an amazing improvement in her lipid panel. I'll call her later today.

## 2015-04-30 NOTE — Assessment & Plan Note (Signed)
Patient agrees to call and schedule a mammogram

## 2015-04-30 NOTE — Assessment & Plan Note (Signed)
Last thyroid tests reviewed; she has gained weight, over holiday season, no constipation; if weight gain continues despite efforts at eating better, then we'll recheck TSH in the coming weeks

## 2015-10-24 ENCOUNTER — Encounter: Payer: Self-pay | Admitting: Family Medicine

## 2015-10-25 ENCOUNTER — Encounter: Payer: Self-pay | Admitting: Family Medicine

## 2015-11-04 ENCOUNTER — Encounter: Payer: BLUE CROSS/BLUE SHIELD | Admitting: Family Medicine

## 2015-11-08 ENCOUNTER — Ambulatory Visit: Payer: BLUE CROSS/BLUE SHIELD | Admitting: Family Medicine

## 2015-11-11 ENCOUNTER — Encounter: Payer: Self-pay | Admitting: Family Medicine

## 2015-11-14 ENCOUNTER — Other Ambulatory Visit: Payer: Self-pay | Admitting: Family Medicine

## 2015-11-14 MED ORDER — LORAZEPAM 0.5 MG PO TABS: 0.2500 mg | ORAL_TABLET | Freq: Four times a day (QID) | ORAL | 0 refills | Status: DC | PRN

## 2015-11-14 NOTE — Progress Notes (Signed)
Rx called in by M.D.

## 2015-12-02 ENCOUNTER — Emergency Department
Admission: EM | Admit: 2015-12-02 | Discharge: 2015-12-02 | Disposition: A | Discharge status: Home / Self Care | Payer: BLUE CROSS/BLUE SHIELD | Attending: Student in an Organized Health Care Education/Training Program | Admitting: Student in an Organized Health Care Education/Training Program

## 2015-12-02 ENCOUNTER — Emergency Department: Payer: BLUE CROSS/BLUE SHIELD

## 2015-12-02 ENCOUNTER — Encounter: Payer: Self-pay | Admitting: Emergency Medicine

## 2015-12-02 DIAGNOSIS — F329 Major depressive disorder, single episode, unspecified: Secondary | ICD-10-CM | POA: Diagnosis not present

## 2015-12-02 DIAGNOSIS — Z79899 Other long term (current) drug therapy: Secondary | ICD-10-CM | POA: Insufficient documentation

## 2015-12-02 DIAGNOSIS — R42 Dizziness and giddiness: Secondary | ICD-10-CM | POA: Diagnosis not present

## 2015-12-02 DIAGNOSIS — Z87891 Personal history of nicotine dependence: Secondary | ICD-10-CM | POA: Diagnosis not present

## 2015-12-02 DIAGNOSIS — Z7982 Long term (current) use of aspirin: Secondary | ICD-10-CM | POA: Diagnosis not present

## 2015-12-02 DIAGNOSIS — R0602 Shortness of breath: Secondary | ICD-10-CM | POA: Diagnosis not present

## 2015-12-02 DIAGNOSIS — E039 Hypothyroidism, unspecified: Secondary | ICD-10-CM | POA: Insufficient documentation

## 2015-12-02 LAB — CBC
HCT: 38.5 % (ref 35.0–47.0)
Hemoglobin: 13.3 g/dL (ref 12.0–16.0)
MCH: 30.2 pg (ref 26.0–34.0)
MCHC: 34.6 g/dL (ref 32.0–36.0)
MCV: 87.3 fL (ref 80.0–100.0)
PLATELETS: 188 10*3/uL (ref 150–440)
RBC: 4.41 MIL/uL (ref 3.80–5.20)
RDW: 13 % (ref 11.5–14.5)
WBC: 7 10*3/uL (ref 3.6–11.0)

## 2015-12-02 LAB — BASIC METABOLIC PANEL
Anion gap: 6 (ref 5–15)
BUN: 17 mg/dL (ref 6–20)
CALCIUM: 9.2 mg/dL (ref 8.9–10.3)
CO2: 26 mmol/L (ref 22–32)
CREATININE: 0.72 mg/dL (ref 0.44–1.00)
Chloride: 107 mmol/L (ref 101–111)
Glucose, Bld: 92 mg/dL (ref 65–99)
Potassium: 3.7 mmol/L (ref 3.5–5.1)
SODIUM: 139 mmol/L (ref 135–145)

## 2015-12-02 LAB — URINALYSIS COMPLETE WITH MICROSCOPIC (ARMC ONLY)
Bacteria, UA: NONE SEEN
Bilirubin Urine: NEGATIVE
Glucose, UA: NEGATIVE mg/dL
KETONES UR: NEGATIVE mg/dL
Nitrite: NEGATIVE
PROTEIN: NEGATIVE mg/dL
SPECIFIC GRAVITY, URINE: 1.004 — AB (ref 1.005–1.030)
pH: 7 (ref 5.0–8.0)

## 2015-12-02 LAB — FIBRIN DERIVATIVES D-DIMER (ARMC ONLY): FIBRIN DERIVATIVES D-DIMER (ARMC): 129 (ref 0–499)

## 2015-12-02 LAB — TROPONIN I

## 2015-12-02 LAB — BRAIN NATRIURETIC PEPTIDE: B NATRIURETIC PEPTIDE 5: 24 pg/mL (ref 0.0–100.0)

## 2015-12-02 MED ORDER — PREDNISONE 20 MG PO TABS
40.0000 mg | ORAL_TABLET | Freq: Every day | ORAL | 0 refills | Status: AC
Start: 2015-12-02 — End: 2015-12-07

## 2015-12-02 MED ORDER — ALBUTEROL SULFATE HFA 108 (90 BASE) MCG/ACT IN AERS: 2.0000 | INHALATION_SPRAY | Freq: Four times a day (QID) | RESPIRATORY_TRACT | 2 refills | Status: DC | PRN

## 2015-12-02 MED ORDER — LORAZEPAM 1 MG PO TABS
1.0000 mg | ORAL_TABLET | Freq: Once | ORAL | Status: AC
  Administered 2015-12-02: 1 mg via ORAL
  Filled 2015-12-02: qty 1

## 2015-12-02 MED ORDER — IPRATROPIUM-ALBUTEROL 0.5-2.5 (3) MG/3ML IN SOLN
3.0000 mL | Freq: Once | RESPIRATORY_TRACT | Status: AC
  Administered 2015-12-02: 3 mL via RESPIRATORY_TRACT
  Filled 2015-12-02: qty 3

## 2015-12-02 NOTE — ED Notes (Signed)
meds administered as ordered - report given to [employer]

## 2015-12-02 NOTE — ED Triage Notes (Signed)
Patient states that for the last two weeks she has been short of breath and dizzy. Patient states shortness of breath is present at rest intermittently and with exertion. Patient reports that the dizziness occurs when she stands up.

## 2015-12-02 NOTE — ED Provider Notes (Signed)
[facility] Emergency Department Provider Note    First MD Initiated Contact with Patient 12/02/15 Alejandra Mcdaniel     (approximate)  I have reviewed the triage vital signs and the nursing notes.   HISTORY  Chief Complaint Shortness of Breath and Dizziness    HPI Alejandra Mcdaniel is a 55 y.o. female 2 weeks of shortness of breath and dizziness. Patient states that the shortness of breath is most pronounced on the patient goes to bed at night. States that the symptoms started 2 weeks ago after the patient recently lost 2 family members within 3 weeks of each other. States that she's been feeling very depressed and more fatigued than normal. Denies any chest pain. Denies any nausea or vomiting. Denies any diaphoresis. States that the shortness of breath is constant throughout the day and is not worsened by exertion. She does not have any orthopnea. States that when she is laying in bed at night she will feel her heart pounding in her throat and an overwhelming sense of doom. States that she no longer smokes cigarettes and quit that 3 years ago but currently vapes.     Past Medical History:  Diagnosis Date  . Dysrhythmia    occasional palpatations (secondary to thyroid disease)  . Hypercholesteremia   . Hyperlipidemia   . Hypothyroidism   . PONV (postoperative nausea and vomiting)     Patient Active Problem List   Diagnosis Date Noted  . Viral upper respiratory infection 01/24/2015  . Special screening for malignant neoplasms, colon   . Benign neoplasm of descending colon   . Benign neoplasm of sigmoid colon   . Colon cancer screening 10/31/2014  . Breast cancer screening 10/31/2014  . Electronic cigarette use 10/31/2014  . Preventative health care 10/31/2014  . Screening for cervical cancer 10/31/2014  . Hyperlipidemia   . Hypothyroidism   . Encounter for medication monitoring 10/11/2014  . Routine gynecological examination 11/10/2012    Past Surgical  History:  Procedure Laterality Date  . CESAREAN SECTION  x2  . COLONOSCOPY WITH PROPOFOL N/A 12/03/2014   Procedure: COLONOSCOPY WITH PROPOFOL;  Surgeon: Lucilla Lame, MD;  Location: [place];  Service: Endoscopy;  Laterality: N/A;  . POLYPECTOMY  12/03/2014   Procedure: POLYPECTOMY;  Surgeon: Lucilla Lame, MD;  Location: [place];  Service: Endoscopy;;  . TONSILLECTOMY      Prior to Admission medications   Medication Sig Start Date End Date Taking? Authorizing Provider  albuterol (PROVENTIL HFA;VENTOLIN HFA) 108 (90 Base) MCG/ACT inhaler Inhale 2 puffs into the lungs every 6 (six) hours as needed for wheezing or shortness of breath. 12/02/15   Merlyn Lot, MD  aspirin 81 MG tablet Take 81 mg by mouth daily.    Historical Provider, MD  atorvastatin (LIPITOR) 10 MG tablet Take 1 tablet (10 mg total) by mouth at bedtime. 04/30/15   Arnetha Courser, MD  levothyroxine (SYNTHROID, LEVOTHROID) 50 MCG tablet One by mouth five days per week, one and one-half pills just two days per week 12/29/14   Arnetha Courser, MD  LORazepam (ATIVAN) 0.5 MG tablet Take 0.5-1 tablets (0.25-0.5 mg total) by mouth every 6 (six) hours as needed for anxiety. 11/14/15   Arnetha Courser, MD  predniSONE (DELTASONE) 20 MG tablet Take 2 tablets (40 mg total) by mouth daily. 12/02/15 12/07/15  Merlyn Lot, MD    Allergies Review of patient's allergies indicates no known allergies.  Family History  Problem Relation Age of Onset  .  Heart disease Father   . Diabetes Father   . Hyperlipidemia Father   . Thyroid disease Father   . COPD Sister   . Cancer Brother     kidney  . Diabetes Brother   . Hypertension Brother   . Diabetes Sister   . Cancer Mother     cervical  . Heart disease Mother   . Diabetes Mother   . Hyperlipidemia Mother     Social History Social History  Substance Use Topics  . Smoking status: Former Smoker    Packs/day: 2.00    Years: 40.00    Types: E-cigarettes    Quit  date: 12/01/2012  . Smokeless tobacco: Never Used     Comment: uses Vapor inhaler  . Alcohol use No    Review of Systems Patient denies headaches, rhinorrhea, blurry vision, numbness, shortness of breath, chest pain, edema, cough, abdominal pain, nausea, vomiting, diarrhea, dysuria, fevers, rashes or hallucinations unless otherwise stated above in HPI. ____________________________________________   PHYSICAL EXAM:  VITAL SIGNS: Vitals:   12/02/15 1810  BP: 135/89  Pulse: 69  Resp: 18  Temp: 98.4 F (36.9 C)    Constitutional: Alert and oriented. Tearful, anxious appearing Eyes: Conjunctivae are normal. PERRL. EOMI. Head: Atraumatic. Nose: No congestion/rhinnorhea. Mouth/Throat: Mucous membranes are moist.  Oropharynx non-erythematous. Neck: No stridor. Painless ROM. No cervical spine tenderness to palpation Hematological/Lymphatic/Immunilogical: No cervical lymphadenopathy. Cardiovascular: Normal rate, regular rhythm. Grossly normal heart sounds.  Good peripheral circulation. Respiratory: Normal respiratory effort.  No retractions. Lungs CTAB. Gastrointestinal: Soft and nontender. No distention. No abdominal bruits. No CVA tenderness. Genitourinary:  Musculoskeletal: No lower extremity tenderness nor edema.  No joint effusions. Neurologic:  Normal speech and language. No gross focal neurologic deficits are appreciated. No gait instability. Skin:  Skin is warm, dry and intact. No rash noted. Psychiatric: Mood and affect are normal. Speech and behavior are normal.  ____________________________________________   LABS (all labs ordered are listed, but only abnormal results are displayed)  Results for orders placed or performed during the hospital encounter of 12/02/15 (from the past 24 hour(s))  Basic metabolic panel     Status: None   Collection Time: 12/02/15  6:55 PM  Result Value Ref Range   Sodium 139 135 - 145 mmol/L   Potassium 3.7 3.5 - 5.1 mmol/L   Chloride 107 101  - 111 mmol/L   CO2 26 22 - 32 mmol/L   Glucose, Bld 92 65 - 99 mg/dL   BUN 17 6 - 20 mg/dL   Creatinine, Ser 0.72 0.44 - 1.00 mg/dL   Calcium 9.2 8.9 - 10.3 mg/dL   GFR calc non Af Amer >60 >60 mL/min   GFR calc Af Amer >60 >60 mL/min   Anion gap 6 5 - 15  CBC     Status: None   Collection Time: 12/02/15  6:55 PM  Result Value Ref Range   WBC 7.0 3.6 - 11.0 K/uL   RBC 4.41 3.80 - 5.20 MIL/uL   Hemoglobin 13.3 12.0 - 16.0 g/dL   HCT 38.5 35.0 - 47.0 %   MCV 87.3 80.0 - 100.0 fL   MCH 30.2 26.0 - 34.0 pg   MCHC 34.6 32.0 - 36.0 g/dL   RDW 13.0 11.5 - 14.5 %   Platelets 188 150 - 440 K/uL  Troponin I     Status: None   Collection Time: 12/02/15  6:55 PM  Result Value Ref Range   Troponin I <0.03 <0.03 ng/mL  Fibrin  derivatives D-Dimer ([facility] only)     Status: None   Collection Time: 12/02/15  6:55 PM  Result Value Ref Range   Fibrin derivatives D-dimer ([facility]) 129 0 - 499  Urinalysis complete, with microscopic     Status: Abnormal   Collection Time: 12/02/15  6:56 PM  Result Value Ref Range   Color, Urine COLORLESS (A) YELLOW   APPearance CLEAR (A) CLEAR   Glucose, UA NEGATIVE NEGATIVE mg/dL   Bilirubin Urine NEGATIVE NEGATIVE   Ketones, ur NEGATIVE NEGATIVE mg/dL   Specific Gravity, Urine 1.004 (L) 1.005 - 1.030   Hgb urine dipstick 1+ (A) NEGATIVE   pH 7.0 5.0 - 8.0   Protein, ur NEGATIVE NEGATIVE mg/dL   Nitrite NEGATIVE NEGATIVE   Leukocytes, UA 1+ (A) NEGATIVE   RBC / HPF 0-5 0 - 5 RBC/hpf   WBC, UA 0-5 0 - 5 WBC/hpf   Bacteria, UA NONE SEEN NONE SEEN   Squamous Epithelial / LPF 0-5 (A) NONE SEEN  Brain natriuretic peptide     Status: None   Collection Time: 12/02/15  6:56 PM  Result Value Ref Range   B Natriuretic Peptide 24.0 0.0 - 100.0 pg/mL   ____________________________________________  EKG My review and personal interpretation at Time: 18:21   Indication: SOB,   Rate: 65  Rhythm: nsr Axis: normal Other: IRBB, nonspecific changes, no acute  ischemia ____________________________________________  RADIOLOGY  CXR my read shows no evidence of acute cardiopulmonary process.  ____________________________________________   PROCEDURES  Procedure(s) performed: none    Critical Care performed: no ____________________________________________   INITIAL IMPRESSION / ASSESSMENT AND PLAN / ED COURSE  Pertinent labs & imaging results that were available during my care of the patient were reviewed by me and considered in my medical decision making (see chart for details).  DDX: COPD, anxiety, ACS, PE, pneumonia  Alejandra Mcdaniel is a 55 y.o. who presents to the ED with shortness of breath. Arrives afebrile and hemodynamic stable.  His x-ray without focal abnormality.  The patient will be placed on continuous pulse oximetry and telemetry for monitoring.  Laboratory evaluation will be sent to evaluate for the above complaints.     Clinical Course  Comment By Time  Patient reassessed. No evidence of acute heart failure. Troponin is negative. D-dimer is negative. Symptoms markedly improved after albuterol treatment and Ativan. To suspect this is multifactorial including acute bronchitis given her history of smoking as well as symptoms worsened by anxiety and recent stressors from home. The patient's able to ambulate with a steady gait and has no acute hypoxia. Do feel patient is stable for outpatient management.  Have discussed with the patient and available family all diagnostics and treatments performed thus far and all questions were answered to the best of my ability. The patient demonstrates understanding and agreement with plan.  Merlyn Lot, MD 09/01 2011     ____________________________________________   FINAL CLINICAL IMPRESSION(S) / ED DIAGNOSES  Final diagnoses:  Shortness of breath      NEW MEDICATIONS STARTED DURING THIS VISIT:  Discharge Medication List as of 12/02/2015  8:09 PM    START taking these  medications   Details  albuterol (PROVENTIL HFA;VENTOLIN HFA) 108 (90 Base) MCG/ACT inhaler Inhale 2 puffs into the lungs every 6 (six) hours as needed for wheezing or shortness of breath., Starting Fri 12/02/2015, Print    predniSONE (DELTASONE) 20 MG tablet Take 2 tablets (40 mg total) by mouth daily., Starting Fri 12/02/2015, Until Wed 12/07/2015, Print  Note:  This document was prepared using Dragon voice recognition software and may include unintentional dictation errors.    Merlyn Lot, MD 12/03/15 [phone]

## 2015-12-31 ENCOUNTER — Other Ambulatory Visit: Payer: Self-pay | Admitting: Family Medicine

## 2015-12-31 NOTE — Telephone Encounter (Signed)
Six month supply prescribed Sept 2016; doubt compliance; overdue for labs Note on Rx asking for appt and labs soon for further refills

## 2016-01-25 ENCOUNTER — Other Ambulatory Visit: Payer: Self-pay | Admitting: Family Medicine

## 2016-01-25 NOTE — Telephone Encounter (Signed)
Patient has appointment for 02-13-16. She is needing a refill on levothyroxine and atorvastatin. She is asking to send to [facility]. Have enough for about 2 weeks.

## 2016-01-26 MED ORDER — ATORVASTATIN CALCIUM 10 MG PO TABS: 10.0000 mg | ORAL_TABLET | Freq: Every day | ORAL | 0 refills | Status: DC

## 2016-01-26 MED ORDER — LEVOTHYROXINE SODIUM 50 MCG PO TABS: ORAL_TABLET | ORAL | 0 refills | Status: DC

## 2016-01-26 NOTE — Telephone Encounter (Signed)
Last TSH Sept 2016; due for labs Last ALT and lipids were Jan 2017; will get labs when pt comes in [place] for refills

## 2016-02-13 ENCOUNTER — Encounter: Payer: Self-pay | Admitting: Family Medicine

## 2016-02-13 ENCOUNTER — Ambulatory Visit (INDEPENDENT_AMBULATORY_CARE_PROVIDER_SITE_OTHER): Payer: BLUE CROSS/BLUE SHIELD | Admitting: Family Medicine

## 2016-02-13 VITALS — BP 118/74 | HR 73 | Temp 98.3°F | Resp 14 | Ht 62.0 in | Wt 144.2 lb

## 2016-02-13 DIAGNOSIS — E039 Hypothyroidism, unspecified: Secondary | ICD-10-CM | POA: Diagnosis not present

## 2016-02-13 DIAGNOSIS — Z789 Other specified health status: Secondary | ICD-10-CM | POA: Diagnosis not present

## 2016-02-13 DIAGNOSIS — F331 Major depressive disorder, recurrent, moderate: Secondary | ICD-10-CM

## 2016-02-13 DIAGNOSIS — Z5181 Encounter for therapeutic drug level monitoring: Secondary | ICD-10-CM | POA: Diagnosis not present

## 2016-02-13 DIAGNOSIS — E782 Mixed hyperlipidemia: Secondary | ICD-10-CM | POA: Diagnosis not present

## 2016-02-13 DIAGNOSIS — F339 Major depressive disorder, recurrent, unspecified: Secondary | ICD-10-CM | POA: Insufficient documentation

## 2016-02-13 LAB — LIPID PANEL
CHOL/HDL RATIO: 3 ratio (ref <5.0)
Cholesterol: 194 mg/dL (ref <200)
HDL: 65 mg/dL (ref >50)
LDL CALC: 116 mg/dL — AB (ref <100)
Triglycerides: 64 mg/dL (ref <150)
VLDL: 13 mg/dL (ref <30)

## 2016-02-13 LAB — COMPLETE METABOLIC PANEL WITH GFR
ALT: 13 U/L (ref 6–29)
AST: 17 U/L (ref 10–35)
Albumin: 4.4 g/dL (ref 3.6–5.1)
Alkaline Phosphatase: 59 U/L (ref 33–130)
BUN: 13 mg/dL (ref 7–25)
CALCIUM: 9.8 mg/dL (ref 8.6–10.4)
CHLORIDE: 104 mmol/L (ref 98–110)
CO2: 30 mmol/L (ref 20–31)
CREATININE: 0.74 mg/dL (ref 0.50–1.05)
GFR, Est Non African American: >89 mL/min (ref >=60)
Glucose, Bld: 82 mg/dL (ref 65–99)
POTASSIUM: 4.2 mmol/L (ref 3.5–5.3)
Sodium: 140 mmol/L (ref 135–146)
Total Bilirubin: 0.8 mg/dL (ref 0.2–1.2)
Total Protein: 6.7 g/dL (ref 6.1–8.1)

## 2016-02-13 LAB — TSH: TSH: 3.3 mIU/L

## 2016-02-13 MED ORDER — ATORVASTATIN CALCIUM 10 MG PO TABS: 10.0000 mg | ORAL_TABLET | Freq: Every day | ORAL | 5 refills | Status: DC

## 2016-02-13 MED ORDER — ESCITALOPRAM OXALATE 10 MG PO TABS: 10.0000 mg | ORAL_TABLET | Freq: Every day | ORAL | 0 refills | Status: DC

## 2016-02-13 NOTE — Assessment & Plan Note (Signed)
Check sgpt 

## 2016-02-13 NOTE — Assessment & Plan Note (Signed)
With night-time awakenings; start SSRI; advised reasons to seek medical care, if escalating into mania or hypomania; suggested counseling, but she says she has good support and faith community; close f/u in 4 weeks

## 2016-02-13 NOTE — Patient Instructions (Signed)
Try to limit saturated fats in your diet (bologna, hot dogs, barbeque, cheeseburgers, hamburgers, steak, bacon, sausage, cheese, etc.) and get more fresh fruits, vegetables, and whole grains Start the new medicines Return for follow-up in 3-4 weeks, but call sooner if needed I do encourage you to quit smoking Call [phone] to sign up for smoking cessation classes You can call 1-800-QUIT-NOW to talk with a smoking cessation coach

## 2016-02-13 NOTE — Assessment & Plan Note (Signed)
Check TSH 

## 2016-02-13 NOTE — Assessment & Plan Note (Signed)
Check lipids, continue statin 

## 2016-02-13 NOTE — Progress Notes (Signed)
BP 118/74 (BP Location: Left Arm, Patient Position: Sitting, Cuff Size: Normal)   Pulse 73   Temp 98.3 F (36.8 C) (Oral)   Resp 14   Ht 5\' 2"  (1.575 m)   Wt 144 lb 4 oz (65.4 kg)   SpO2 98%   BMI 26.38 kg/m    Subjective:    Patient ID: Alejandra Mcdaniel, female    DOB: 08-11-1960, 55 y.o.   MRN: [id number]  HPI: Alejandra Mcdaniel is a 55 y.o. female  Chief Complaint  Patient presents with  . Medication Refill   Patient is here for f/u Still dealing with stress from over the summer, personal loss She has some support, two sisters Not sleeping well; no nightmares; sometimes hard to falling asleep; then getting up every 30 minutes through the night She was prescribed some benzos in the summer, and still has one left; only used Paxil for a short time, but hard to come off of Little things get to her Took something back years ago Eating the same, no excessive weight loss vaping menthol, but no other flavors, and hoping to stop Breathing now is okay; went to ER in Sept; when she gets anxious, she gets tight High cholesterol; fasting now; out of medicine for a week; typical American diet Hypothyroidism; dose stable; her father, but mother may have had it; grandson born with it  Depression screen [facility] 2/9 02/13/2016 10/29/2014  Decreased Interest 0 0  Down, Depressed, Hopeless 0 0  PHQ - 2 Score 0 0   Relevant past medical, surgical, family and social history reviewed Past Medical History:  Diagnosis Date  . Dysrhythmia    occasional palpatations (secondary to thyroid disease)  . Hypercholesteremia   . Hyperlipidemia   . Hypothyroidism   . PONV (postoperative nausea and vomiting)    Past Surgical History:  Procedure Laterality Date  . CESAREAN SECTION  x2  . COLONOSCOPY WITH PROPOFOL N/A 12/03/2014   Procedure: COLONOSCOPY WITH PROPOFOL;  Surgeon: Lucilla Lame, MD;  Location: [place];  Service: Endoscopy;  Laterality: N/A;  . POLYPECTOMY  12/03/2014   Procedure:  POLYPECTOMY;  Surgeon: Lucilla Lame, MD;  Location: [place];  Service: Endoscopy;;  . TONSILLECTOMY     Family History  Problem Relation Age of Onset  . Heart disease Father   . Diabetes Father   . Hyperlipidemia Father   . Thyroid disease Father   . COPD Sister   . Cancer Brother     kidney  . Diabetes Brother   . Hypertension Brother   . Diabetes Sister   . Cancer Mother     cervical  . Heart disease Mother   . Diabetes Mother   . Hyperlipidemia Mother   MD note: patient's brother and one sister died this summer within weeks of each other  Social History  Substance Use Topics  . Smoking status: Former Smoker    Packs/day: 2.00    Years: 40.00    Types: E-cigarettes    Quit date: 12/01/2012  . Smokeless tobacco: Never Used     Comment: uses Vapor inhaler  . Alcohol use No   Interim medical history since last visit reviewed. Allergies and medications reviewed  Review of Systems Per HPI unless specifically indicated above     Objective:    BP 118/74 (BP Location: Left Arm, Patient Position: Sitting, Cuff Size: Normal)   Pulse 73   Temp 98.3 F (36.8 C) (Oral)   Resp 14  Ht 5\' 2"  (1.575 m)   Wt 144 lb 4 oz (65.4 kg)   SpO2 98%   BMI 26.38 kg/m   Wt Readings from Last 3 Encounters:  02/13/16 144 lb 4 oz (65.4 kg)  12/02/15 145 lb (65.8 kg)  04/29/15 141 lb (64 kg)    Physical Exam  Constitutional: She appears well-developed and well-nourished. No distress.  Weight gain 3 pounds over last 9-1/2 months  Eyes: No scleral icterus.  Neck: No thyromegaly present.  Cardiovascular: Normal rate and regular rhythm.   Pulmonary/Chest: Effort normal and breath sounds normal.  Abdominal: She exhibits no distension.  Musculoskeletal: She exhibits no edema.  Neurological: She is alert.  Skin: Skin is warm. No pallor.  Psychiatric: Her mood appears not anxious. Her affect is not blunt and not inappropriate. Her speech is not rapid and/or pressured. She is  not slowed.  Very briefly tearful when discussing recent deaths of brother and sister; otherwise, good eye contact; not despondent   Results for orders placed or performed during the hospital encounter of 0000000  Basic metabolic panel  Result Value Ref Range   Sodium 139 135 - 145 mmol/L   Potassium 3.7 3.5 - 5.1 mmol/L   Chloride 107 101 - 111 mmol/L   CO2 26 22 - 32 mmol/L   Glucose, Bld 92 65 - 99 mg/dL   BUN 17 6 - 20 mg/dL   Creatinine, Ser 0.72 0.44 - 1.00 mg/dL   Calcium 9.2 8.9 - 10.3 mg/dL   GFR calc non Af Amer >60 >60 mL/min   GFR calc Af Amer >60 >60 mL/min   Anion gap 6 5 - 15  CBC  Result Value Ref Range   WBC 7.0 3.6 - 11.0 K/uL   RBC 4.41 3.80 - 5.20 MIL/uL   Hemoglobin 13.3 12.0 - 16.0 g/dL   HCT 38.5 35.0 - 47.0 %   MCV 87.3 80.0 - 100.0 fL   MCH 30.2 26.0 - 34.0 pg   MCHC 34.6 32.0 - 36.0 g/dL   RDW 13.0 11.5 - 14.5 %   Platelets 188 150 - 440 K/uL  Urinalysis complete, with microscopic  Result Value Ref Range   Color, Urine COLORLESS (A) YELLOW   APPearance CLEAR (A) CLEAR   Glucose, UA NEGATIVE NEGATIVE mg/dL   Bilirubin Urine NEGATIVE NEGATIVE   Ketones, ur NEGATIVE NEGATIVE mg/dL   Specific Gravity, Urine 1.004 (L) 1.005 - 1.030   Hgb urine dipstick 1+ (A) NEGATIVE   pH 7.0 5.0 - 8.0   Protein, ur NEGATIVE NEGATIVE mg/dL   Nitrite NEGATIVE NEGATIVE   Leukocytes, UA 1+ (A) NEGATIVE   RBC / HPF 0-5 0 - 5 RBC/hpf   WBC, UA 0-5 0 - 5 WBC/hpf   Bacteria, UA NONE SEEN NONE SEEN   Squamous Epithelial / LPF 0-5 (A) NONE SEEN  Brain natriuretic peptide  Result Value Ref Range   B Natriuretic Peptide 24.0 0.0 - 100.0 pg/mL  Troponin I  Result Value Ref Range   Troponin I <0.03 <0.03 ng/mL  Fibrin derivatives D-Dimer ([facility] only)  Result Value Ref Range   Fibrin derivatives D-dimer ([facility]) 129 0 - 499      Assessment & Plan:   Problem List Items Addressed This Visit      Endocrine   Hypothyroidism    Check TSH      Relevant Orders   TSH      Other   Hyperlipidemia    Check lipids, continue statin  Relevant Medications   atorvastatin (LIPITOR) 10 MG tablet   Other Relevant Orders   Lipid panel   Encounter for medication monitoring    Check sgpt      Relevant Orders   COMPLETE METABOLIC PANEL WITH GFR   Electronic cigarette use    Avoid flavored vapes      Depression, major, 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  An after-visit summary was printed and given to the patient at [place].  Please see the patient instructions which may contain other information and recommendations beyond what is mentioned above in the assessment and plan.  Meds ordered this encounter  Medications  . escitalopram (LEXAPRO) 10 MG tablet    Sig: Take 1 tablet (10 mg total) by mouth daily.    Dispense:  30 tablet    Refill:  0  . atorvastatin (LIPITOR) 10 MG tablet    Sig: Take 1 tablet (10 mg total) by mouth at bedtime.    Dispense:  30 tablet    Refill:  5    Orders Placed This Encounter  Procedures  . TSH  . Lipid panel  . COMPLETE METABOLIC PANEL WITH GFR

## 2016-02-13 NOTE — Assessment & Plan Note (Signed)
Avoid flavored vapes

## 2016-02-20 ENCOUNTER — Telehealth: Payer: Self-pay

## 2016-02-20 NOTE — Telephone Encounter (Signed)
Please just call and check up on her Stop medicine if causing side effects

## 2016-02-20 NOTE — Telephone Encounter (Signed)
Patient was prescribed a new medication this week; lexapro. Pt sister in law called and stated last night and this morning pt became dizzy and near syncopal episode. She does not remember her son speaking to her. The triage nurse recommend pt to go to ER or call 911 ASAP. The nurse call the sister in law back and she stated she took her to the Er. Nurse advise her to make sure she has her updated Med list in hand.

## 2016-02-21 ENCOUNTER — Telehealth: Payer: Self-pay

## 2016-02-21 NOTE — Telephone Encounter (Signed)
Thank you I'll take it off the med list

## 2016-02-21 NOTE — Telephone Encounter (Signed)
Called left voicemail yesterday, tried to call back today.

## 2016-02-21 NOTE — Telephone Encounter (Signed)
Patient called back feeling much better and has stopped the Lexapro.  Lexapro has been added to allergies.

## 2016-03-05 ENCOUNTER — Other Ambulatory Visit: Payer: Self-pay | Admitting: Family Medicine

## 2016-03-05 NOTE — Telephone Encounter (Signed)
Nov 2017 tsh reviewed; Rx approved

## 2016-03-13 ENCOUNTER — Ambulatory Visit: Payer: BLUE CROSS/BLUE SHIELD | Admitting: Family Medicine

## 2016-08-29 ENCOUNTER — Other Ambulatory Visit: Payer: Self-pay

## 2016-08-29 MED ORDER — LEVOTHYROXINE SODIUM 50 MCG PO TABS: ORAL_TABLET | ORAL | 1 refills | Status: DC

## 2016-08-29 NOTE — Telephone Encounter (Signed)
90 day supply

## 2016-09-18 ENCOUNTER — Telehealth: Payer: Self-pay | Admitting: Family Medicine

## 2016-09-19 MED ORDER — ATORVASTATIN CALCIUM 10 MG PO TABS: 10.0000 mg | ORAL_TABLET | Freq: Every day | ORAL | 0 refills | Status: DC

## 2016-09-19 NOTE — Telephone Encounter (Signed)
lvm for pt to return call °

## 2016-09-19 NOTE — Telephone Encounter (Signed)
Please ask pt to schedule a f/u visit Last seen in Nov 2017 I'll refill medicine as requested

## 2016-09-21 NOTE — Telephone Encounter (Signed)
lvm for pt to schedule appt °

## 2016-10-08 ENCOUNTER — Ambulatory Visit (INDEPENDENT_AMBULATORY_CARE_PROVIDER_SITE_OTHER): Payer: BLUE CROSS/BLUE SHIELD | Admitting: Family Medicine

## 2016-10-08 ENCOUNTER — Encounter: Payer: Self-pay | Admitting: Family Medicine

## 2016-10-08 VITALS — BP 104/62 | HR 83 | Temp 98.3°F | Resp 16 | Wt 143.5 lb

## 2016-10-08 DIAGNOSIS — Z5181 Encounter for therapeutic drug level monitoring: Secondary | ICD-10-CM | POA: Diagnosis not present

## 2016-10-08 DIAGNOSIS — Z1231 Encounter for screening mammogram for malignant neoplasm of breast: Secondary | ICD-10-CM

## 2016-10-08 DIAGNOSIS — F3342 Major depressive disorder, recurrent, in full remission: Secondary | ICD-10-CM | POA: Diagnosis not present

## 2016-10-08 DIAGNOSIS — E782 Mixed hyperlipidemia: Secondary | ICD-10-CM | POA: Diagnosis not present

## 2016-10-08 DIAGNOSIS — E039 Hypothyroidism, unspecified: Secondary | ICD-10-CM

## 2016-10-08 DIAGNOSIS — Z789 Other specified health status: Secondary | ICD-10-CM | POA: Diagnosis not present

## 2016-10-08 DIAGNOSIS — R5383 Other fatigue: Secondary | ICD-10-CM

## 2016-10-08 DIAGNOSIS — Z1239 Encounter for other screening for malignant neoplasm of breast: Secondary | ICD-10-CM

## 2016-10-08 LAB — TSH: TSH: 5.22 m[IU]/L — AB

## 2016-10-08 MED ORDER — LORAZEPAM 0.5 MG PO TABS: 0.2500 mg | ORAL_TABLET | Freq: Four times a day (QID) | ORAL | 0 refills | Status: DC | PRN

## 2016-10-08 NOTE — Assessment & Plan Note (Signed)
Check fasting lipids; continue statin

## 2016-10-08 NOTE — Patient Instructions (Addendum)
Avoid flavored e-cigs We'll get labs today Try to limit saturated fats in your diet (bologna, hot dogs, barbeque, cheeseburgers, hamburgers, steak, bacon, sausage, cheese, etc.) and get more fresh fruits, vegetables, and whole grains Follow-up with me or at [facility] in six months for follow-up

## 2016-10-08 NOTE — Progress Notes (Signed)
BP 104/62   Pulse 83   Temp 98.3 F (36.8 C) (Oral)   Resp 16   Wt 143 lb 8 oz (65.1 kg)   SpO2 98%   BMI 26.25 kg/m    Subjective:    Patient ID: Alejandra Mcdaniel, female    DOB: Dec 18, 1960, 56 y.o.   MRN: [id number]  HPI: Alejandra Mcdaniel is a 56 y.o. female  Chief Complaint  Patient presents with  . 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 part time work otherwise, not worrying about that  High cholesterol; loves cheese and steak and shrimp; not many eggs; taking statin; no myalgias Lab Results  Component Value Date   CHOL 187 10/08/2016   HDL 70 10/08/2016   LDLCALC 87 10/08/2016   TRIG 150 (H) 10/08/2016   CHOLHDL 2.7 10/08/2016    Hypothyroidism; primary, poor energy level, never has any energy; stays exhausted; no sleep apnea; does snore; had sleep test and just nores; no hx of anemia; if too much thyroid med, gets hot; check today Lab Results  Component Value Date   TSH 5.22 (H) 10/08/2016   Breathing is doing okay; hot humid days are worse; just getting over a cold  Taking zyrtec for allergies and sinuses; perfusmes tears her up  Depression screen [facility] 2/9 10/08/2016 02/13/2016 10/29/2014  Decreased Interest 0 0 0  Down, Depressed, Hopeless 0 0 0  PHQ - 2 Score 0 0 0    Relevant past medical, surgical, family and social history reviewed Past Medical History:  Diagnosis Date  . Dysrhythmia    occasional palpatations (secondary to thyroid disease)  . Hypercholesteremia   . Hyperlipidemia   . Hypothyroidism   . PONV (postoperative nausea and vomiting)    Past Surgical History:  Procedure Laterality Date  . CESAREAN SECTION  x2  . COLONOSCOPY WITH PROPOFOL N/A 12/03/2014   Procedure: COLONOSCOPY WITH PROPOFOL;  Surgeon: Lucilla Lame, MD;   Location: [facility];  Service: Endoscopy;  Laterality: N/A;  . POLYPECTOMY  12/03/2014   Procedure: POLYPECTOMY;  Surgeon: Lucilla Lame, MD;  Location: [place];  Service: Endoscopy;;  . TONSILLECTOMY     Family History  Problem Relation Age of Onset  . Heart disease Father   . Diabetes Father   . Hyperlipidemia Father   . Thyroid disease Father   . COPD Sister   . Cancer Brother        kidney  . Diabetes Brother   . Hypertension Brother   . Diabetes Sister   . Cancer Mother        cervical  . Heart disease Mother   . Diabetes Mother   . Hyperlipidemia Mother    Social History   Social History  . Marital status: Divorced    Spouse name: N/A  . Number of children: N/A  . Years of education: N/A   Occupational History  . Not on file.   Social History Main Topics  . Smoking status: Former Smoker    Packs/day: 2.00    Years: 40.00    Types: E-cigarettes    Quit date: 12/01/2012  . Smokeless tobacco: Never Used     Comment: uses Vapor inhaler  . Alcohol use No  . Drug use: No  .  Sexual activity: Not Currently   Other Topics Concern  . Not on file   Social History Narrative   ** Merged History Encounter **        Interim medical history since last visit reviewed. Allergies and medications reviewed  Review of Systems Per HPI unless specifically indicated above     Objective:    BP 104/62   Pulse 83   Temp 98.3 F (36.8 C) (Oral)   Resp 16   Wt 143 lb 8 oz (65.1 kg)   SpO2 98%   BMI 26.25 kg/m   Wt Readings from Last 3 Encounters:  10/08/16 143 lb 8 oz (65.1 kg)  02/13/16 144 lb 4 oz (65.4 kg)  12/02/15 145 lb (65.8 kg)    Physical Exam  Constitutional: She appears well-developed and well-nourished. No distress.  Weight gain 3 pounds over last 9-1/2 months  Eyes: No scleral icterus.  Neck: No thyromegaly present.  Cardiovascular: Normal rate and regular rhythm.   Pulmonary/Chest: Effort normal and breath sounds normal.    Abdominal: She exhibits no distension.  Musculoskeletal: She exhibits no edema.  Neurological: She is alert.  Skin: Skin is warm. No pallor.  Psychiatric: Her mood appears not anxious. Her affect is not blunt. Her speech is not rapid and/or pressured. She is not slowed. She does not exhibit a depressed mood.  good eye contact; not despondent, not tearful       Assessment & Plan:   Problem List Items Addressed This Visit      Endocrine   Hypothyroidism    Check level and adjust if needed      Relevant Orders   TSH (Completed)     Other   Hyperlipidemia - Primary    Check fasting lipids; continue statin      Relevant Orders   Lipid panel (Completed)   Fatigue    Check TSH and vit D      Relevant Orders   VITAMIN D 25 Hydroxy (Vit-D Deficiency, Fractures) (Completed)   Encounter for medication monitoring    Monitor SGPT on the statin      Relevant Orders   COMPLETE METABOLIC PANEL WITH GFR (Completed)   Electronic cigarette use    Warned to not use flavored cigarettes      Depression, major, recurrent ([place])    Improved; aware that the anniversaries of her siblings' deaths may cause her some increased anxiety; very limited Rx for benzo provided; [place] web site reviewed, no red flags      Relevant Medications   LORazepam (ATIVAN) 0.5 MG tablet    Other Visit Diagnoses    Screening for breast cancer       Relevant Orders   MM Digital Screening       Follow up plan: Return in about 6 months (around 04/10/2017) for twenty minute follow-up with fasting labs.  An after-visit summary was printed and given to the patient at [place].  Please see the patient instructions which may contain other information and recommendations beyond what is mentioned above in the assessment and plan.  Meds ordered this encounter  Medications  . cetirizine (ZYRTEC) 10 MG tablet    Sig: Take 10 mg by mouth daily.  Marland Kitchen DISCONTD: LORazepam (ATIVAN) 0.5 MG tablet    Sig: Take 0.5-1  tablets (0.25-0.5 mg total) by mouth every 6 (six) hours as needed for anxiety.    Dispense:  8 tablet    Refill:  0  . LORazepam (ATIVAN) 0.5 MG tablet  Sig: Take 0.5-1 tablets (0.25-0.5 mg total) by mouth every 6 (six) hours as needed for anxiety.    Dispense:  8 tablet    Refill:  0    Orders Placed This Encounter  Procedures  . MM Digital Screening  . VITAMIN D 25 Hydroxy (Vit-D Deficiency, Fractures)  . TSH  . COMPLETE METABOLIC PANEL WITH GFR  . Lipid panel

## 2016-10-08 NOTE — Assessment & Plan Note (Addendum)
Improved; aware that the anniversaries of her siblings' deaths may cause her some increased anxiety; very limited Rx for benzo provided; [place] web site reviewed, no red flags

## 2016-10-08 NOTE — Assessment & Plan Note (Signed)
Check TSH and vit D

## 2016-10-08 NOTE — Assessment & Plan Note (Signed)
Monitor SGPT on the statin

## 2016-10-08 NOTE — Assessment & Plan Note (Signed)
Warned to not use flavored cigarettes

## 2016-10-08 NOTE — Assessment & Plan Note (Signed)
Check level and adjust if needed

## 2016-10-09 LAB — LIPID PANEL
Cholesterol: 187 mg/dL (ref <200)
HDL: 70 mg/dL (ref >50)
LDL CALC: 87 mg/dL (ref <100)
Total CHOL/HDL Ratio: 2.7 Ratio (ref <5.0)
Triglycerides: 150 mg/dL — ABNORMAL HIGH (ref <150)
VLDL: 30 mg/dL (ref <30)

## 2016-10-09 LAB — COMPLETE METABOLIC PANEL WITH GFR
ALBUMIN: 4.3 g/dL (ref 3.6–5.1)
ALK PHOS: 55 U/L (ref 33–130)
ALT: 18 U/L (ref 6–29)
AST: 19 U/L (ref 10–35)
BILIRUBIN TOTAL: 0.9 mg/dL (ref 0.2–1.2)
BUN: 17 mg/dL (ref 7–25)
CO2: 25 mmol/L (ref 20–31)
Calcium: 9.1 mg/dL (ref 8.6–10.4)
Chloride: 105 mmol/L (ref 98–110)
Creat: 0.74 mg/dL (ref 0.50–1.05)
GLUCOSE: 74 mg/dL (ref 65–99)
Potassium: 4.2 mmol/L (ref 3.5–5.3)
SODIUM: 139 mmol/L (ref 135–146)
TOTAL PROTEIN: 6.7 g/dL (ref 6.1–8.1)

## 2016-10-09 LAB — VITAMIN D 25 HYDROXY (VIT D DEFICIENCY, FRACTURES): Vit D, 25-Hydroxy: 21 ng/mL — ABNORMAL LOW (ref 30–100)

## 2016-10-18 ENCOUNTER — Telehealth: Payer: Self-pay

## 2016-10-18 DIAGNOSIS — E039 Hypothyroidism, unspecified: Secondary | ICD-10-CM

## 2016-10-18 NOTE — Telephone Encounter (Signed)
Pt wants to know since her TSH test came back abnormal will you be adjusting her thyroid medication.

## 2016-10-19 MED ORDER — LEVOTHYROXINE SODIUM 50 MCG PO TABS
ORAL_TABLET | ORAL | 1 refills | Status: DC
Start: 2016-10-19 — End: 2017-05-24

## 2016-10-19 MED ORDER — VITAMIN D (ERGOCALCIFEROL) 1.25 MG (50000 UNIT) PO CAPS: 50000.0000 [IU] | ORAL_CAPSULE | ORAL | 9 refills | Status: AC

## 2016-10-19 NOTE — Telephone Encounter (Signed)
I thanked patient for her call Discussed low vit D; with her level of fatigue, start Rx for 4 weeks Increase thyroid dose somewhat; 75 mcg was too much Recheck labs in 6 weeks

## 2016-10-25 ENCOUNTER — Other Ambulatory Visit: Payer: Self-pay | Admitting: Family Medicine

## 2016-10-25 NOTE — Telephone Encounter (Signed)
July 2018 sgpt and lipids reviewed; Rx approved

## 2016-11-19 ENCOUNTER — Encounter: Payer: Self-pay | Admitting: Family Medicine

## 2016-11-19 ENCOUNTER — Ambulatory Visit (INDEPENDENT_AMBULATORY_CARE_PROVIDER_SITE_OTHER): Payer: BLUE CROSS/BLUE SHIELD | Admitting: Family Medicine

## 2016-11-19 ENCOUNTER — Telehealth: Payer: Self-pay

## 2016-11-19 VITALS — BP 110/64 | HR 69 | Temp 98.4°F | Resp 16 | Ht 62.03 in | Wt 146.9 lb

## 2016-11-19 DIAGNOSIS — Z23 Encounter for immunization: Secondary | ICD-10-CM

## 2016-11-19 DIAGNOSIS — Z0000 Encounter for general adult medical examination without abnormal findings: Secondary | ICD-10-CM | POA: Diagnosis not present

## 2016-11-19 DIAGNOSIS — Z87891 Personal history of nicotine dependence: Secondary | ICD-10-CM | POA: Diagnosis not present

## 2016-11-19 MED ORDER — FLUTICASONE PROPIONATE 50 MCG/ACT NA SUSP: 2.0000 | Freq: Every day | NASAL | 11 refills | Status: DC

## 2016-11-19 MED ORDER — CETIRIZINE HCL 10 MG PO TABS: 10.0000 mg | ORAL_TABLET | Freq: Every day | ORAL | 11 refills | Status: DC | PRN

## 2016-11-19 NOTE — Assessment & Plan Note (Signed)
USPSTF grade A and B recommendations reviewed with patient; age-appropriate recommendations, preventive care, screening tests, etc discussed and encouraged; healthy living encouraged; see AVS for patient education given to patient  

## 2016-11-19 NOTE — Assessment & Plan Note (Signed)
80 pack years; refer for low dose chest CT

## 2016-11-19 NOTE — Telephone Encounter (Signed)
done

## 2016-11-19 NOTE — Patient Instructions (Addendum)
Please feel free to send me a copy of your [facility] screening I do encourage you to quit smoking Call [phone] to sign up for smoking cessation classes You can call 1-800-QUIT-NOW to talk with a smoking cessation coach To help with vitamin D: get some time outdoors or take 1,000 iu of over-the-counter vitamin D3 daily Return for just the thyroid lab on September 6th; no appointment needed; you do not need to be fasting  You received the vaccine to protect against tetanus and diphtheria and pertussis today; the tetanus and diphtheria portions will provide protection up to ten years, and the pertussis component will give you protection against whooping cough for life  Return in 4 months for your next Shingrix booster  Steps to Quit Smoking Smoking tobacco can be bad for your health. It can also affect almost every organ in your body. Smoking puts you and people around you at risk for many serious long-lasting (chronic) diseases. Quitting smoking is hard, but it is one of the best things that you can do for your health. It is never too late to quit. What are the benefits of quitting smoking? When you quit smoking, you lower your risk for getting serious diseases and conditions. They can include:  Lung cancer or lung disease.  Heart disease.  Stroke.  Heart attack.  Not being able to have children (infertility).  Weak bones (osteoporosis) and broken bones (fractures).  If you have coughing, wheezing, and shortness of breath, those symptoms may get better when you quit. You may also get sick less often. If you are pregnant, quitting smoking can help to lower your chances of having a baby of low birth weight. What can I do to help me quit smoking? Talk with your doctor about what can help you quit smoking. Some things you can do (strategies) include:  Quitting smoking totally, instead of slowly cutting back how much you smoke over a period of time.  Going to in-person counseling. You  are more likely to quit if you go to many counseling sessions.  Using resources and support systems, such as: ? Database administrator with a Social worker. ? Phone quitlines. ? Careers information officer. ? Support groups or group counseling. ? Text messaging programs. ? Mobile phone apps or applications.  Taking medicines. Some of these medicines may have nicotine in them. If you are pregnant or breastfeeding, do not take any medicines to quit smoking unless your doctor says it is okay. Talk with your doctor about counseling or other things that can help you.  Talk with your doctor about using more than one strategy at the same time, such as taking medicines while you are also going to in-person counseling. This can help make quitting easier. What things can I do to make it easier to quit? Quitting smoking might feel very hard at first, but there is a lot that you can do to make it easier. Take these steps:  Talk to your family and friends. Ask them to support and encourage you.  Call phone quitlines, reach out to support groups, or work with a Social worker.  Ask people who smoke to not smoke around you.  Avoid places that make you want (trigger) to smoke, such as: ? Bars. ? Parties. ? Smoke-break areas at work.  Spend time with people who do not smoke.  Lower the stress in your life. Stress can make you want to smoke. Try these things to help your stress: ? Getting regular exercise. ? Deep-breathing exercises. ?  Yoga. ? Meditating. ? Doing a body scan. To do this, close your eyes, focus on one area of your body at a time from head to toe, and notice which parts of your body are tense. Try to relax the muscles in those areas.  Download or buy apps on your mobile phone or tablet that can help you stick to your quit plan. There are many free apps, such as QuitGuide from the [employer] Office manager for Disease Control and Prevention). You can find more support from smokefree.gov and other websites.  This  information is not intended to replace advice given to you by your health care provider. Make sure you discuss any questions you have with your health care provider. Document Released: 01/13/2009 Document Revised: 11/15/2015 Document Reviewed: 08/03/2014 Elsevier Interactive Patient Education  2018 [place] Maintenance, Female Adopting a healthy lifestyle and getting preventive care can go a long way to promote health and wellness. Talk with your health care provider about what schedule of regular examinations is right for you. This is a good chance for you to check in with your provider about disease prevention and staying healthy. In between checkups, there are plenty of things you can do on your own. Experts have done a lot of research about which lifestyle changes and preventive measures are most likely to keep you healthy. Ask your health care provider for more information. Weight and diet Eat a healthy diet  Be sure to include plenty of vegetables, fruits, low-fat dairy products, and lean protein.  Do not eat a lot of foods high in solid fats, added sugars, or salt.  Get regular exercise. This is one of the most important things you can do for your health. ? Most adults should exercise for at least 150 minutes each week. The exercise should increase your heart rate and make you sweat (moderate-intensity exercise). ? Most adults should also do strengthening exercises at least twice a week. This is in addition to the moderate-intensity exercise.  Maintain a healthy weight  Body mass index (BMI) is a measurement that can be used to identify possible weight problems. It estimates body fat based on height and weight. Your health care provider can help determine your BMI and help you achieve or maintain a healthy weight.  For females 22 years of age and older: ? A BMI below 18.5 is considered underweight. ? A BMI of 18.5 to 24.9 is normal. ? A BMI of 25 to 29.9 is considered  overweight. ? A BMI of 30 and above is considered obese.  Watch levels of cholesterol and blood lipids  You should start having your blood tested for lipids and cholesterol at 57 years of age, then have this test every 5 years.  You may need to have your cholesterol levels checked more often if: ? Your lipid or cholesterol levels are high. ? You are older than 57 years of age. ? You are at high risk for heart disease.  Cancer screening Lung Cancer  Lung cancer screening is recommended for adults 75-45 years old who are at high risk for lung cancer because of a history of smoking.  A yearly low-dose CT scan of the lungs is recommended for people who: ? Currently smoke. ? Have quit within the past 15 years. ? Have at least a 30-pack-year history of smoking. A pack year is smoking an average of one pack of cigarettes a day for 1 year.  Yearly screening should continue until it has been 15 years  since you quit.  Yearly screening should stop if you develop a health problem that would prevent you from having lung cancer treatment.  Breast Cancer  Practice breast self-awareness. This means understanding how your breasts normally appear and feel.  It also means doing regular breast self-exams. Let your health care provider know about any changes, no matter how small.  If you are in your 20s or 30s, you should have a clinical breast exam (CBE) by a health care provider every 1-3 years as part of a regular health exam.  If you are 20 or older, have a CBE every year. Also consider having a breast X-ray (mammogram) every year.  If you have a family history of breast cancer, talk to your health care provider about genetic screening.  If you are at high risk for breast cancer, talk to your health care provider about having an MRI and a mammogram every year.  Breast cancer gene (BRCA) assessment is recommended for women who have family members with BRCA-related cancers. BRCA-related cancers  include: ? Breast. ? Ovarian. ? Tubal. ? Peritoneal cancers.  Results of the assessment will determine the need for genetic counseling and BRCA1 and BRCA2 testing.  Cervical Cancer Your health care provider may recommend that you be screened regularly for cancer of the pelvic organs (ovaries, uterus, and vagina). This screening involves a pelvic examination, including checking for microscopic changes to the surface of your cervix (Pap test). You may be encouraged to have this screening done every 3 years, beginning at age 68.  For women ages 32-65, health care providers may recommend pelvic exams and Pap testing every 3 years, or they may recommend the Pap and pelvic exam, combined with testing for human papilloma virus (HPV), every 5 years. Some types of HPV increase your risk of cervical cancer. Testing for HPV may also be done on women of any age with unclear Pap test results.  Other health care providers may not recommend any screening for nonpregnant women who are considered low risk for pelvic cancer and who do not have symptoms. Ask your health care provider if a screening pelvic exam is right for you.  If you have had past treatment for cervical cancer or a condition that could lead to cancer, you need Pap tests and screening for cancer for at least 20 years after your treatment. If Pap tests have been discontinued, your risk factors (such as having a new sexual partner) need to be reassessed to determine if screening should resume. Some women have medical problems that increase the chance of getting cervical cancer. In these cases, your health care provider may recommend more frequent screening and Pap tests.  Colorectal Cancer  This type of cancer can be detected and often prevented.  Routine colorectal cancer screening usually begins at 57 years of age and continues through 57 years of age.  Your health care provider may recommend screening at an earlier age if you have risk factors  for colon cancer.  Your health care provider may also recommend using home test kits to check for hidden blood in the stool.  A small camera at the end of a tube can be used to examine your colon directly (sigmoidoscopy or colonoscopy). This is done to check for the earliest forms of colorectal cancer.  Routine screening usually begins at age 76.  Direct examination of the colon should be repeated every 5-10 years through 57 years of age. However, you may need to be screened more often if  early forms of precancerous polyps or small growths are found.  Skin Cancer  Check your skin from head to toe regularly.  Tell your health care provider about any new moles or changes in moles, especially if there is a change in a mole's shape or color.  Also tell your health care provider if you have a mole that is larger than the size of a pencil eraser.  Always use sunscreen. Apply sunscreen liberally and repeatedly throughout the day.  Protect yourself by wearing long sleeves, pants, a wide-brimmed hat, and sunglasses whenever you are outside.  Heart disease, diabetes, and high blood pressure  High blood pressure causes heart disease and increases the risk of stroke. High blood pressure is more likely to develop in: ? People who have blood pressure in the high end of the normal range (130-139/85-89 mm Hg). ? People who are overweight or obese. ? People who are African American.  If you are 3-71 years of age, have your blood pressure checked every 3-5 years. If you are 20 years of age or older, have your blood pressure checked every year. You should have your blood pressure measured twice-once when you are at a hospital or clinic, and once when you are not at a hospital or clinic. Record the average of the two measurements. To check your blood pressure when you are not at a hospital or clinic, you can use: ? An automated blood pressure machine at a pharmacy. ? A home blood pressure monitor.  If  you are between 74 years and 50 years old, ask your health care provider if you should take aspirin to prevent strokes.  Have regular diabetes screenings. This involves taking a blood sample to check your fasting blood sugar level. ? If you are at a normal weight and have a low risk for diabetes, have this test once every three years after 57 years of age. ? If you are overweight and have a high risk for diabetes, consider being tested at a younger age or more often. Preventing infection Hepatitis B  If you have a higher risk for hepatitis B, you should be screened for this virus. You are considered at high risk for hepatitis B if: ? You were born in a country where hepatitis B is common. Ask your health care provider which countries are considered high risk. ? Your parents were born in a high-risk country, and you have not been immunized against hepatitis B (hepatitis B vaccine). ? You have HIV or AIDS. ? You use needles to inject street drugs. ? You live with someone who has hepatitis B. ? You have had sex with someone who has hepatitis B. ? You get hemodialysis treatment. ? You take certain medicines for conditions, including cancer, organ transplantation, and autoimmune conditions.  Hepatitis C  Blood testing is recommended for: ? Everyone born from [birth year] through 1966. ? Anyone with known risk factors for hepatitis C.  Sexually transmitted infections (STIs)  You should be screened for sexually transmitted infections (STIs) including gonorrhea and chlamydia if: ? You are sexually active and are younger than 57 years of age. ? You are older than 57 years of age and your health care provider tells you that you are at risk for this type of infection. ? Your sexual activity has changed since you were last screened and you are at an increased risk for chlamydia or gonorrhea. Ask your health care provider if you are at risk.  If you do not have HIV,  but are at risk, it may be recommended  that you take a prescription medicine daily to prevent HIV infection. This is called pre-exposure prophylaxis (PrEP). You are considered at risk if: ? You are sexually active and do not regularly use condoms or know the HIV status of your partner(s). ? You take drugs by injection. ? You are sexually active with a partner who has HIV.  Talk with your health care provider about whether you are at high risk of being infected with HIV. If you choose to begin PrEP, you should first be tested for HIV. You should then be tested every 3 months for as long as you are taking PrEP. Pregnancy  If you are premenopausal and you may become pregnant, ask your health care provider about preconception counseling.  If you may become pregnant, take 400 to 800 micrograms (mcg) of folic acid every day.  If you want to prevent pregnancy, talk to your health care provider about birth control (contraception). Osteoporosis and menopause  Osteoporosis is a disease in which the bones lose minerals and strength with aging. This can result in serious bone fractures. Your risk for osteoporosis can be identified using a bone density scan.  If you are 18 years of age or older, or if you are at risk for osteoporosis and fractures, ask your health care provider if you should be screened.  Ask your health care provider whether you should take a calcium or vitamin D supplement to lower your risk for osteoporosis.  Menopause may have certain physical symptoms and risks.  Hormone replacement therapy may reduce some of these symptoms and risks. Talk to your health care provider about whether hormone replacement therapy is right for you. Follow these instructions at home:  Schedule regular health, dental, and eye exams.  Stay current with your immunizations.  Do not use any tobacco products including cigarettes, chewing tobacco, or electronic cigarettes.  If you are pregnant, do not drink alcohol.  If you are  breastfeeding, limit how much and how often you drink alcohol.  Limit alcohol intake to no more than 1 drink per day for nonpregnant women. One drink equals 12 ounces of beer, 5 ounces of wine, or 1 ounces of hard liquor.  Do not use street drugs.  Do not share needles.  Ask your health care provider for help if you need support or information about quitting drugs.  Tell your health care provider if you often feel depressed.  Tell your health care provider if you have ever been abused or do not feel safe at home. This information is not intended to replace advice given to you by your health care provider. Make sure you discuss any questions you have with your health care provider. Document Released: 10/02/2010 Document Revised: 08/25/2015 Document Reviewed: 12/21/2014 Elsevier Interactive Patient Education  [employer].

## 2016-11-19 NOTE — Progress Notes (Signed)
Patient ID: Alejandra Mcdaniel, female   DOB: [birth year], 56 y.o.   MRN: [id number]   Subjective:   Alejandra Mcdaniel is a 56 y.o. female here for a complete physical exam  Interim issues since last visit: nothing major  USPSTF grade A and B recommendations Depression:  Depression screen [facility] 2/9 11/19/2016 10/08/2016 02/13/2016 10/29/2014  Decreased Interest 0 0 0 0  Down, Depressed, Hopeless 0 0 0 0  PHQ - 2 Score 0 0 0 0   Hypertension: BP Readings from Last 3 Encounters:  11/19/16 110/64  10/08/16 104/62  02/13/16 118/74   Obesity: Wt Readings from Last 3 Encounters:  11/19/16 146 lb 14.4 oz (66.6 kg)  10/08/16 143 lb 8 oz (65.1 kg)  02/13/16 144 lb 4 oz (65.4 kg)   BMI Readings from Last 3 Encounters:  11/19/16 26.85 kg/m  10/08/16 26.25 kg/m  02/13/16 26.38 kg/m    Alcohol: rare rare Tobacco use: e-cigs only, not flavored; interested in quitting but does not want to use medicine  HIV, hep B, hep C: done STD testing and prevention (chl/gon/syphilis): not interested Intimate partner violence:no abuse Breast cancer: mammogram ordered 10/08/16 BRCA gene screening: no breast or ovarian cancer other than one paternal aunt Cervical cancer screening: last done 10/29/2014 Osteoporosis: wellness check at a church; had bone density, ABI, neck checked; has results at home Fall prevention/vitamin D: taking vit D, finished the last Rx last week Lipids:  Lab Results  Component Value Date   CHOL 187 10/08/2016   CHOL 194 02/13/2016   CHOL 180 04/29/2015   Lab Results  Component Value Date   HDL 70 10/08/2016   HDL 65 02/13/2016   HDL 62 04/29/2015   Lab Results  Component Value Date   LDLCALC 87 10/08/2016   LDLCALC 116 (H) 02/13/2016   LDLCALC 95 04/29/2015   Lab Results  Component Value Date   TRIG 150 (H) 10/08/2016   TRIG 64 02/13/2016   TRIG 114 04/29/2015   Lab Results  Component Value Date   CHOLHDL 2.7 10/08/2016   CHOLHDL 3.0 02/13/2016   No results  found for: LDLDIRECT Glucose:  Glucose, Bld  Date Value Ref Range Status  10/08/2016 74 65 - 99 mg/dL Final  02/13/2016 82 65 - 99 mg/dL Final  12/02/2015 92 65 - 99 mg/dL Final   Colorectal cancer: no fam hx of colon cancer; last colonoscopy 12/03/14 Lung cancer:  Order chest CT AAA: n/a Aspirin: taking Diet: loves fruits and veggies; loves dark green Exercise: "nah", willing to try to walk more Skin cancer: nothing owrrisome  Past Medical History:  Diagnosis Date  . Dysrhythmia    occasional palpatations (secondary to thyroid disease)  . Hypercholesteremia   . Hyperlipidemia   . Hypothyroidism   . PONV (postoperative nausea and vomiting)    Past Surgical History:  Procedure Laterality Date  . CESAREAN SECTION  x2  . COLONOSCOPY WITH PROPOFOL N/A 12/03/2014   Procedure: COLONOSCOPY WITH PROPOFOL;  Surgeon: Lucilla Lame, MD;  Location: [place];  Service: Endoscopy;  Laterality: N/A;  . POLYPECTOMY  12/03/2014   Procedure: POLYPECTOMY;  Surgeon: Lucilla Lame, MD;  Location: [place];  Service: Endoscopy;;  . TONSILLECTOMY     Family History  Problem Relation Age of Onset  . Heart disease Father   . Diabetes Father   . Hyperlipidemia Father   . Thyroid disease Father   . COPD Sister   . Heart disease Sister   . Diabetes  Brother   . Hypertension Brother   . Hepatitis C Brother   . Kidney disease Brother   . Diabetes Sister   . Cancer Mother        cervical  . Heart disease Mother   . Diabetes Mother   . Hyperlipidemia Mother   . Heart disease Maternal Grandfather   . Cancer Brother        kidney    Social History  Substance Use Topics  . Smoking status: Former Smoker    Packs/day: 2.00    Years: 40.00    Types: Cigarettes    Quit date: 12/01/2012  . Smokeless tobacco: Never Used  . Alcohol use No  MD note: uses e-cigs; not flavored at all  Review of Systems  Objective:   Vitals:   11/19/16 1057  BP: 110/64  Pulse: 69  Resp: 16   Temp: 98.4 F (36.9 C)  TempSrc: Oral  SpO2: 98%  Weight: 146 lb 14.4 oz (66.6 kg)  Height: 5' 2.03" (1.575 m)   Body mass index is 26.85 kg/m. Wt Readings from Last 3 Encounters:  11/19/16 146 lb 14.4 oz (66.6 kg)  10/08/16 143 lb 8 oz (65.1 kg)  02/13/16 144 lb 4 oz (65.4 kg)   Physical Exam  Constitutional: She appears well-developed and well-nourished.  HENT:  Head: Normocephalic and atraumatic.  Right Ear: Hearing, tympanic membrane, external ear and ear canal normal.  Left Ear: Hearing, tympanic membrane, external ear and ear canal normal.  Eyes: Conjunctivae and EOM are normal. Right eye exhibits no hordeolum. Left eye exhibits no hordeolum. No scleral icterus.  Neck: Carotid bruit is not present. No thyromegaly present.  Cardiovascular: Normal rate, regular rhythm, S1 normal, S2 normal and normal heart sounds.   No extrasystoles are present.  Pulmonary/Chest: Effort normal and breath sounds normal. No respiratory distress. Right breast exhibits no inverted nipple, no mass, no nipple discharge, no skin change and no tenderness. Left breast exhibits no inverted nipple, no mass, no nipple discharge, no skin change and no tenderness. Breasts are symmetrical.  Abdominal: Soft. Normal appearance and bowel sounds are normal. She exhibits no distension, no abdominal bruit, no pulsatile midline mass and no mass. There is no hepatosplenomegaly. There is no tenderness. No hernia.  Musculoskeletal: Normal range of motion. She exhibits no edema.  Lymphadenopathy:       Head (right side): No submandibular adenopathy present.       Head (left side): No submandibular adenopathy present.    She has no cervical adenopathy.    She has no axillary adenopathy.  Neurological: She is alert. She displays no tremor. No cranial nerve deficit. She exhibits normal muscle tone. Gait normal.  Reflex Scores:      Patellar reflexes are 2+ on the right side and 2+ on the left side. Skin: Skin is warm and  dry. No bruising and no ecchymosis noted. No cyanosis. No pallor.  Posterolateral LEFT arm, 3 to 4 mm, fibrous, no doming, no telangiectasia  Psychiatric: Her speech is normal and behavior is normal. Thought content normal. Her mood appears not anxious. She does not exhibit a depressed mood.    Assessment/Plan:   Problem List Items Addressed This Visit      Other   Preventative health care - Primary    USPSTF grade A and B recommendations reviewed with patient; age-appropriate recommendations, preventive care, screening tests, etc discussed and encouraged; healthy living encouraged; see AVS for patient education given to patient  Need for shingles vaccine   Relevant Orders   Varicella-zoster vaccine IM (Shingrix) (Completed)   Need for diphtheria-tetanus-pertussis (Tdap) vaccine   Relevant Orders   Tdap vaccine greater than or equal to 7yo IM (Completed)   Hx of tobacco use, presenting hazards to health    80 pack years; refer for low dose chest CT      Relevant Orders   CT CHEST LUNG CA SCREEN LOW DOSE W/O CM       No orders of the defined types were placed in this encounter.  Orders Placed This Encounter  Procedures  . CT CHEST LUNG CA SCREEN LOW DOSE W/O CM    Standing Status:   Future    Standing Expiration Date:   01/19/2018    Order Specific Question:   Reason for Exam (SYMPTOM  OR DIAGNOSIS REQUIRED)    Answer:   80 pack years    Order Specific Question:   Preferred Imaging Location?    Answer:   [facility]    Order Specific Question:   Is patient pregnant?    Answer:   No    Order Specific Question:   Preferred imaging location?    Answer:   [facility]    Order Specific Question:   Radiology Contrast Protocol - do NOT remove file path    Answer:   [email or web address]  . Tdap vaccine greater than or equal to 7yo IM  . Varicella-zoster vaccine IM (Shingrix)    Follow up plan: Return in about 1 year (around  11/19/2017) for complete physical; return in 4 months for your next Shingrix booster.  An After Visit Summary was printed and given to the patient.

## 2016-11-19 NOTE — Telephone Encounter (Signed)
Pt would like to get something prescribed for allergies

## 2016-11-21 ENCOUNTER — Ambulatory Visit
Admission: RE | Admit: 2016-11-21 | Discharge: 2016-11-21 | Disposition: A | Discharge status: Home / Self Care | Payer: BLUE CROSS/BLUE SHIELD | Source: Ambulatory Visit | Attending: Family Medicine | Admitting: Family Medicine

## 2016-11-21 DIAGNOSIS — Z1239 Encounter for other screening for malignant neoplasm of breast: Secondary | ICD-10-CM

## 2016-11-21 DIAGNOSIS — Z1231 Encounter for screening mammogram for malignant neoplasm of breast: Secondary | ICD-10-CM | POA: Insufficient documentation

## 2016-11-21 LAB — HM MAMMOGRAPHY

## 2016-11-26 ENCOUNTER — Telehealth: Payer: Self-pay | Admitting: *Deleted

## 2016-11-26 DIAGNOSIS — Z122 Encounter for screening for malignant neoplasm of respiratory organs: Secondary | ICD-10-CM

## 2016-11-26 DIAGNOSIS — Z87891 Personal history of nicotine dependence: Secondary | ICD-10-CM

## 2016-11-26 NOTE — Telephone Encounter (Signed)
Received referral for initial lung cancer screening scan. Contacted patient and obtained smoking history,(former, quit 12/01/12, 80 pack year) as well as answering questions related to screening process. Patient denies signs of lung cancer such as weight loss or hemoptysis. Patient denies comorbidity that would prevent curative treatment if lung cancer were found. Patient is scheduled for shared decision making visit and CT scan on 12/05/16.

## 2016-11-26 NOTE — Telephone Encounter (Signed)
Received referral for low dose lung cancer screening CT scan. Message left at phone number listed in EMR for patient to call me back to facilitate scheduling scan.  

## 2016-12-05 ENCOUNTER — Inpatient Hospital Stay: Payer: BLUE CROSS/BLUE SHIELD | Attending: Oncology | Admitting: Oncology

## 2016-12-05 ENCOUNTER — Ambulatory Visit
Admission: RE | Admit: 2016-12-05 | Discharge: 2016-12-05 | Disposition: A | Discharge status: Home / Self Care | Payer: BLUE CROSS/BLUE SHIELD | Source: Ambulatory Visit | Attending: Oncology | Admitting: Oncology

## 2016-12-05 DIAGNOSIS — Z87891 Personal history of nicotine dependence: Secondary | ICD-10-CM

## 2016-12-05 DIAGNOSIS — J439 Emphysema, unspecified: Secondary | ICD-10-CM | POA: Insufficient documentation

## 2016-12-05 DIAGNOSIS — Z122 Encounter for screening for malignant neoplasm of respiratory organs: Secondary | ICD-10-CM

## 2016-12-05 DIAGNOSIS — I700 Atherosclerosis of aorta: Secondary | ICD-10-CM | POA: Insufficient documentation

## 2016-12-05 NOTE — Progress Notes (Signed)
In accordance with CMS guidelines, patient has met eligibility criteria including age, absence of signs or symptoms of lung cancer.  Social History  Substance Use Topics  . Smoking status: Former Smoker    Packs/day: 2.00    Years: 40.00    Types: Cigarettes    Quit date: 12/01/2012  . Smokeless tobacco: Never Used  . Alcohol use No     A shared decision-making session was conducted prior to the performance of CT scan. This includes one or more decision aids, includes benefits and harms of screening, follow-up diagnostic testing, over-diagnosis, false positive rate, and total radiation exposure.  Counseling on the importance of adherence to annual lung cancer LDCT screening, impact of co-morbidities, and ability or willingness to undergo diagnosis and treatment is imperative for compliance of the program.  Counseling on the importance of continued smoking cessation for former smokers; the importance of smoking cessation for current smokers, and information about tobacco cessation interventions have been given to patient including [place] and [facility] quit [place] programs.  Written order for lung cancer screening with LDCT has been given to the patient and any and all questions have been answered to the best of my abilities.   Yearly follow up will be coordinated by Burgess Estelle, Thoracic Navigator.  Beckey Rutter, NP 12/05/16 2:54 PM

## 2016-12-06 ENCOUNTER — Other Ambulatory Visit: Payer: Self-pay

## 2016-12-06 DIAGNOSIS — E039 Hypothyroidism, unspecified: Secondary | ICD-10-CM

## 2016-12-06 LAB — TSH: TSH: 4.02 mIU/L (ref 0.40–4.50)

## 2016-12-07 ENCOUNTER — Encounter: Payer: Self-pay | Admitting: *Deleted

## 2016-12-19 ENCOUNTER — Telehealth: Payer: Self-pay | Admitting: Family Medicine

## 2016-12-19 NOTE — Telephone Encounter (Signed)
Follow up on this patient for me

## 2016-12-19 NOTE — Telephone Encounter (Signed)
Patient's chest CT for lung cancer screening showed coronary artery disease Please contact patient, make sure she is aware of the letter's contents Make sure she is taking aspirin 81 mg daily Ask about any cardiac symptoms (chest pain, SHOB or dyspnea on exertion, etc.) If symptomatic, to ER or cardiology referral ASAP If no symptoms, offer appt in the next 2-4 weeks to discuss study and what this means for her in the future

## 2016-12-19 NOTE — Telephone Encounter (Signed)
Left detailed voicemial 

## 2016-12-24 NOTE — Telephone Encounter (Signed)
Pt return our call. She states the only thing she experience some times are SOB and fatigue but denies any other symptoms. She states she spoke to Dr. lada about it. She's taking 81 mg aspirin daily. Also informed pt what to look for in cadiac symptoms ( chest pain, SHOB. Dyspnea on exertion, arm pain, blurred vision, and etc. To Seek help and call 911. Pt schedule an appt with Dr. Sanda Klein

## 2017-01-07 ENCOUNTER — Ambulatory Visit (INDEPENDENT_AMBULATORY_CARE_PROVIDER_SITE_OTHER): Payer: Self-pay | Admitting: Family Medicine

## 2017-01-07 ENCOUNTER — Encounter: Payer: Self-pay | Admitting: Family Medicine

## 2017-01-07 VITALS — BP 112/66 | HR 85 | Temp 98.3°F | Resp 16 | Ht 63.0 in | Wt 144.2 lb

## 2017-01-07 DIAGNOSIS — I700 Atherosclerosis of aorta: Secondary | ICD-10-CM

## 2017-01-07 DIAGNOSIS — J439 Emphysema, unspecified: Secondary | ICD-10-CM | POA: Insufficient documentation

## 2017-01-07 DIAGNOSIS — Z789 Other specified health status: Secondary | ICD-10-CM

## 2017-01-07 DIAGNOSIS — Z23 Encounter for immunization: Secondary | ICD-10-CM

## 2017-01-07 DIAGNOSIS — J432 Centrilobular emphysema: Secondary | ICD-10-CM

## 2017-01-07 HISTORY — DX: Atherosclerosis of aorta: I70.0

## 2017-01-07 HISTORY — DX: Emphysema, unspecified: J43.9

## 2017-01-07 MED ORDER — ATORVASTATIN CALCIUM 20 MG PO TABS: 20.0000 mg | ORAL_TABLET | Freq: Every day | ORAL | 2 refills | Status: DC

## 2017-01-07 NOTE — Assessment & Plan Note (Addendum)
Start statin, and have labs rechecked Nov 20th to December 4th time frame

## 2017-01-07 NOTE — Assessment & Plan Note (Signed)
Encourage cessation; some flavored ones can cause popcorn lung

## 2017-01-07 NOTE — Progress Notes (Signed)
BP 112/66 (BP Location: Left Arm, Patient Position: Sitting, Cuff Size: Normal)   Pulse 85   Temp 98.3 F (36.8 C) (Oral)   Resp 16   Ht 5\' 3"  (1.6 m)   Wt 144 lb 3.2 oz (65.4 kg)   SpO2 98%   BMI 25.54 kg/m    Subjective:    Patient ID: Alejandra Mcdaniel, female    DOB: 03/12/1961, 56 y.o.   MRN: [id number]  HPI: Alejandra Mcdaniel is a 56 y.o. female  Chief Complaint  Patient presents with  . Results    Discuss lab results    HPI Patient is here for discussion of her CT scan In the meantime, had [facility] screening Glucose of 114 was not fasting LDL 108 She has shortness of breath all the time; going on for a year; limits exercise tolerance  Patient had a CT scan done Sept 5, 2018 Chest CT CLINICAL DATA:  Ex-smoker, quitting 4 years ago. Eighty pack-year history. Asymptomatic.  EXAM: CT CHEST WITHOUT CONTRAST LOW-DOSE FOR LUNG CANCER SCREENING  TECHNIQUE: Multidetector CT imaging of the chest was performed following the standard protocol without IV contrast.  COMPARISON:  Plain film 12/02/2015.  No prior CT.  FINDINGS: Cardiovascular: Aortic atherosclerosis. Tortuous thoracic aorta. Normal heart size, without pericardial effusion.  Mediastinum/Nodes: No mediastinal or definite hilar adenopathy, given limitations of unenhanced CT.  Lungs/Pleura: No pleural fluid. Mild centrilobular emphysema. No suspicious pulmonary nodule or mass.  Upper Abdomen: Normal imaged portions of the liver, spleen, stomach, pancreas, adrenal glands, kidneys.  Musculoskeletal: Mild thoracic spondylosis.  IMPRESSION: 1. Lung-RADS 1, negative. Continue annual screening with low-dose chest CT without contrast in 12 months. 2.  Emphysema (ICD10-J43.9). 3.  Aortic Atherosclerosis (ICD10-I70.0).  Electronically Signed   By: Abigail Miyamoto M.D.   On: 12/05/2016 15:54  Depression screen [facility] 2/9 01/07/2017 11/19/2016 10/08/2016 02/13/2016 10/29/2014  Decreased Interest 0 0 0 0  0  Down, Depressed, Hopeless 0 0 0 0 0  PHQ - 2 Score 0 0 0 0 0    Relevant past medical, surgical, family and social history reviewed Past Medical History:  Diagnosis Date  . Aortic atherosclerosis ([place]) 01/07/2017   Noted on chest CT 2018  . Dysrhythmia    occasional palpatations (secondary to thyroid disease)  . Emphysema lung ([place]) 01/07/2017   Chest CT 2018  . Hypercholesteremia   . Hyperlipidemia   . Hypothyroidism   . PONV (postoperative nausea and vomiting)    Past Surgical History:  Procedure Laterality Date  . CESAREAN SECTION  x2  . COLONOSCOPY WITH PROPOFOL N/A 12/03/2014   Procedure: COLONOSCOPY WITH PROPOFOL;  Surgeon: Lucilla Lame, MD;  Location: [place];  Service: Endoscopy;  Laterality: N/A;  . POLYPECTOMY  12/03/2014   Procedure: POLYPECTOMY;  Surgeon: Lucilla Lame, MD;  Location: [place];  Service: Endoscopy;;  . TONSILLECTOMY     Family History  Problem Relation Age of Onset  . Heart disease Father   . Diabetes Father   . Hyperlipidemia Father   . Thyroid disease Father   . COPD Sister   . Heart disease Sister   . Diabetes Brother   . Hypertension Brother   . Hepatitis C Brother   . Kidney disease Brother   . Diabetes Sister   . Cancer Mother        cervical  . Heart disease Mother   . Diabetes Mother   . Hyperlipidemia Mother   . Heart disease Maternal Grandfather   .  Cancer Brother        kidney   . Breast cancer Paternal Aunt 47   Social History   Social History  . Marital status: Divorced    Spouse name: N/A  . Number of children: N/A  . Years of education: N/A   Occupational History  . Not on file.   Social History Main Topics  . Smoking status: Former Smoker    Packs/day: 2.00    Years: 40.00    Types: Cigarettes    Quit date: 12/01/2012  . Smokeless tobacco: Never Used  . Alcohol use No  . Drug use: No  . Sexual activity: Not Currently   Other Topics Concern  . Not on file   Social History Narrative     ** Merged History Encounter **       Interim medical history since last visit reviewed. Allergies and medications reviewed  Review of Systems Per HPI unless specifically indicated above     Objective:    BP 112/66 (BP Location: Left Arm, Patient Position: Sitting, Cuff Size: Normal)   Pulse 85   Temp 98.3 F (36.8 C) (Oral)   Resp 16   Ht 5\' 3"  (1.6 m)   Wt 144 lb 3.2 oz (65.4 kg)   SpO2 98%   BMI 25.54 kg/m   Wt Readings from Last 3 Encounters:  01/07/17 144 lb 3.2 oz (65.4 kg)  12/05/16 145 lb (65.8 kg)  11/19/16 146 lb 14.4 oz (66.6 kg)    Physical Exam  Constitutional: She appears well-developed and well-nourished.  HENT:  Mouth/Throat: Mucous membranes are normal.  Eyes: EOM are normal. No scleral icterus.  Cardiovascular: Normal rate and regular rhythm.   Pulmonary/Chest: Effort normal and breath sounds normal.  Psychiatric: She has a normal mood and affect. Her behavior is normal.    Results for orders placed or performed in visit on 12/06/16  TSH  Result Value Ref Range   TSH 4.02 0.40 - 4.50 mIU/L      Assessment & Plan:   Problem List Items Addressed This Visit      Cardiovascular and Mediastinum   Aortic atherosclerosis ([place]) - Primary    Start statin, and have labs rechecked Nov 20th to December 4th time frame      Relevant Medications   atorvastatin (LIPITOR) 20 MG tablet   Other Relevant Orders   Lipid panel     Respiratory   Emphysema lung ([place])    Apply for patient assistance through [facility] for free medicines        Other   Electronic cigarette use    Encourage cessation; some flavored ones can cause popcorn lung       Other Visit Diagnoses    Needs flu shot       Relevant Orders   Flu Vaccine QUAD 6+ mos PF IM (Fluarix Quad PF) (Completed)       Follow up plan: No Follow-up on file.  An after-visit summary was printed and given to the patient at [facility].  Please see the patient instructions which may contain other  information and recommendations beyond what is mentioned above in the assessment and plan.  Meds ordered this encounter  Medications  . Vitamin D, Ergocalciferol, (DRISDOL) 50000 units CAPS capsule    Sig: Take 50,000 Units by mouth every Wednesday.  Marland Kitchen atorvastatin (LIPITOR) 20 MG tablet    Sig: Take 1 tablet (20 mg total) by mouth at bedtime.    Dispense:  30 tablet  Refill:  2    Dose increase    Orders Placed This Encounter  Procedures  . Flu Vaccine QUAD 6+ mos PF IM (Fluarix Quad PF)  . Lipid panel

## 2017-01-07 NOTE — Patient Instructions (Addendum)
Apply to become a patient at the [facility] Address: [address], [place], [place] [address] Phone: [phone]  Increase the atorvastatin from 10 mg to 20 mg at bedtime Try to limit saturated fats in your diet (bologna, hot dogs, barbeque, cheeseburgers, hamburgers, steak, bacon, sausage, cheese, etc.) and get more fresh fruits, vegetables, and whole grains I will recommend stopping the electronic cigarettes Ideally, have fasting labs done between November 20th and December 4th   Coronary Artery Disease, Female Coronary artery disease (CAD) is a condition in which the arteries that lead to the heart (coronary arteries) become narrow or blocked. The narrowing or blockage can lead to decreased blood flow to the heart. Prolonged reduced blood flow can cause a heart attack (myocardial infarction or MI). This condition may also be called coronary heart disease. Because CAD is the leading cause of death in women, it is important to understand what causes this condition and how it is treated. What are the causes? CAD is most often caused by atherosclerosis. This is the buildup of fat and cholesterol (plaque) on the inside of the arteries. Over time, the plaque may narrow or block the artery, reducing blood flow to the heart. Plaque can also become weak and break off within a coronary artery and cause a sudden blockage. Other less common causes of CAD include:  An embolism or blood clot in a coronary artery.  A tearing of the artery (spontaneous coronary artery dissection).  An aneurysm.  Inflammation (vasculitis) in the artery wall.  What increases the risk? The following factors may make you more likely to develop this condition:  Age. Women over age 12 are at a greater risk of CAD.  Family history of CAD.  High blood pressure (hypertension).  Diabetes.  High cholesterol levels.  Tobacco use.  Lack of exercise.  Menopause. ? All postmenopausal women are at greater  risk of CAD. ? Women who have experienced menopause between the ages of 37-45 (early menopause) are at a higher risk of CAD. ? Women who have experienced menopause before age 84 (premature menopause) are at a very high risk of CAD.  Excessive alcohol use  A diet high in saturated and trans fats, such as fried food and processed meat.  Other possible risk factors include:  High stress levels.  Depression  Obesity.  Sleep apnea.  What are the signs or symptoms? Many people do not have any symptoms during the early stages of CAD. As the condition progresses, symptoms may include:  Chest pain (angina). The pain can: ? Feel like crushing or squeezing, or a tightness, pressure, fullness, or heaviness in the chest. ? Last more than a few minutes or can stop and recur. The pain tends to get worse with exercise or stress and to fade with rest.  Pain in the arms, neck, jaw, or back.  Unexplained heartburn or indigestion.  Shortness of breath.  Nausea.  Sudden cold sweats.  Sudden light-headedness.  Fluttering or fast heartbeat (palpitations).  Many women have chest discomfort and the other symptoms. However, women often have unusual (atypical) symptoms, such as:  Fatigue.  Vomiting.  Unexplained feelings of nervousness or anxiety.  Unexplained weakness.  Dizziness or fainting.  How is this diagnosed? This condition is diagnosed based on:  Your family and medical history.  A physical exam.  Tests, including: ? A test to check the electrical signals in your heart (electrocardiogram). ? Exercise stress test. This looks for signs of blockage when the  heart is stressed with exercise, such as running on a treadmill. ? Pharmacologic stress test. This test looks for signs of blockage when the heart is being stressed with a medicine. ? Blood tests. ? Coronary angiogram. This is a procedure to look at the coronary arteries to see if there is any blockage. During this test,  a dye is injected into your arteries so they appear on an X-ray. ? A test that uses sound waves to take a picture of your heart (echocardiogram). ? Chest X-ray.  How is this treated? This condition may be treated by:  Healthy lifestyle changes to reduce risk factors.  Medicines such as: ? Antiplatelet medicines and blood-thinning medicines, such as aspirin. These help prevent blood clots. ? Nitroglycerin. ? Blood pressure medicines. ? Cholesterol-lowering medicine.  Coronary angioplasty and stenting. During this procedure, a thin, flexible tube is inserted through a blood vessel and into a blocked artery. A balloon or similar device on the end of the tube is inflated to open up the artery. In some cases, a small, mesh tube (stent) is inserted into the artery to keep it open.  Coronary artery bypass surgery. During this surgery, veins or arteries from other parts of the body are used to create a bypass around the blockage and allow blood to reach your heart.  Follow these instructions at home: Medicines  Take over-the-counter and prescription medicines only as told by your health care provider.  Do not take the following medicines unless your health care provider approves: ? NSAIDs, such as ibuprofen, naproxen, or celecoxib. ? Vitamin supplements that contain vitamin A, vitamin E, or both. ? Hormone replacement therapy that contains estrogen with or without progestin. Lifestyle  Follow an exercise program approved by your health care provider. Aim for 150 minutes of moderate exercise or 75 minutes of vigorous exercise each week.  Maintain a healthy weight or lose weight as approved by your health care provider.  Rest when you are tired.  Learn to manage stress or try to limit your stress. Ask your health care provider for suggestions if you need help.  Get screened for depression and seek treatment, if needed.  Do not use any products that contain nicotine or tobacco, such as  cigarettes and e-cigarettes. If you need help quitting, ask your health care provider.  Do not use illegal drugs. Eating and drinking  Follow a heart-healthy diet. A dietitian can help educate you about healthy food options and changes. In general, eat plenty of fruits and vegetables, lean meats, and whole grains.  Avoid foods high in: ? Sugar. ? Salt (sodium). ? Saturated fats, such as processed or fatty meat. ? Trans fats, such as fried food.  Use healthy cooking methods such as roasting, grilling, broiling, baking, poaching, steaming, or stir-frying.  If you drink alcohol, and your health care provider approves, limit your alcohol intake to no more than 1 drink per day. One drink equals 12 ounces of beer, 5 ounces of wine, or 1 ounces of hard liquor. General instructions  Manage any other health conditions, such as hypertension and diabetes. These conditions affect your heart.  Your health care provider may ask you to monitor your blood pressure. Ideally, your blood pressure should be below 130/80.  Keep all follow-up visits as told by your health care provider. This is important. Get help right away if:  You have pain in your chest, neck, arm, jaw, stomach, or back that: ? Lasts more than a few minutes. ? Is recurring. ?  Is not relieved by taking medicine under your tongue (sublingualnitroglycerin).  You have profuse sweating without cause.  You have unexplained: ? Heartburn or indigestion. ? Shortness of breath or difficulty breathing. ? Fluttering or fast heartbeat (palpitations). ? Nausea or vomiting. ? Fatigue. ? Feelings of nervousness or anxiety. ? Weakness. ? Diarrhea.  You have sudden light-headedness or dizziness.  You faint.  You feel like hurting yourself or think about taking your own life. These symptoms may represent a serious problem that is an emergency. Do not wait to see if the symptoms will go away. Get medical help right away. Call your local  emergency services (911 in the [place] Do not drive yourself to the hospital. Summary  Coronary artery disease (CAD) is a process in which the arteries that lead to the heart (coronary arteries) become narrow or blocked. The narrowing or blockage can lead to a heart attack.  Many women have chest discomfort and other common symptoms of CAD. However, women often have different (atypical) symptoms, such as fatigue, vomiting, and dizziness or weakness.  CAD can be treated with lifestyle changes, medicines, surgery, or a combination of these treatments. This information is not intended to replace advice given to you by your health care provider. Make sure you discuss any questions you have with your health care provider. Document Released: 06/11/2011 Document Revised: 03/09/2016 Document Reviewed: 03/09/2016 Elsevier Interactive Patient Education  2017 [employer].

## 2017-01-07 NOTE — Assessment & Plan Note (Signed)
Apply for patient assistance through [facility] for free medicines

## 2017-01-16 DIAGNOSIS — Z720 Tobacco use: Secondary | ICD-10-CM | POA: Insufficient documentation

## 2017-03-21 ENCOUNTER — Ambulatory Visit: Payer: BLUE CROSS/BLUE SHIELD

## 2017-04-10 ENCOUNTER — Other Ambulatory Visit: Payer: Self-pay | Admitting: Family Medicine

## 2017-04-10 NOTE — Telephone Encounter (Signed)
Left detailed voicemial 

## 2017-04-10 NOTE — Telephone Encounter (Signed)
We increased her medicine in October and had hoped that she would have returned for the recheck 6-8 weeks later She is overdue for the recheck now Please ask her to get her cholesterol checked so we can adjust the dose if needed I'll allow a limited Rx to give her a few days to come in Thank you

## 2017-04-15 ENCOUNTER — Ambulatory Visit: Payer: Self-pay

## 2017-04-17 ENCOUNTER — Other Ambulatory Visit: Payer: Self-pay

## 2017-04-17 ENCOUNTER — Other Ambulatory Visit: Payer: Self-pay | Admitting: Family Medicine

## 2017-04-17 DIAGNOSIS — I700 Atherosclerosis of aorta: Secondary | ICD-10-CM

## 2017-04-17 LAB — LIPID PANEL
Cholesterol: 173 mg/dL (ref <200)
HDL: 68 mg/dL (ref >50)
LDL CHOLESTEROL (CALC): 86 mg/dL
Non-HDL Cholesterol (Calc): 105 mg/dL (calc) (ref <130)
Total CHOL/HDL Ratio: 2.5 (calc) (ref <5.0)
Triglycerides: 91 mg/dL (ref <150)

## 2017-04-17 MED ORDER — ATORVASTATIN CALCIUM 40 MG PO TABS: 40.0000 mg | ORAL_TABLET | Freq: Every day | ORAL | 5 refills | Status: DC

## 2017-04-17 NOTE — Progress Notes (Signed)
Increase statin

## 2017-05-18 NOTE — Telephone Encounter (Signed)
Closing out open phone notes Patient has been seen, issue is moot

## 2017-05-18 NOTE — Progress Notes (Signed)
Closing out lab/order note open since:  12/06/16

## 2017-05-18 NOTE — Progress Notes (Signed)
Closing out lab/order note open since:  Jan 2019 

## 2017-05-24 ENCOUNTER — Other Ambulatory Visit: Payer: Self-pay | Admitting: Family Medicine

## 2017-05-24 MED ORDER — LEVOTHYROXINE SODIUM 50 MCG PO TABS: ORAL_TABLET | ORAL | 1 refills | Status: DC

## 2017-05-27 ENCOUNTER — Other Ambulatory Visit: Payer: Self-pay

## 2017-05-27 ENCOUNTER — Telehealth: Payer: Self-pay | Admitting: Family Medicine

## 2017-05-27 DIAGNOSIS — E039 Hypothyroidism, unspecified: Secondary | ICD-10-CM

## 2017-05-27 NOTE — Telephone Encounter (Signed)
Copied from [place] [id number]. Topic: Quick Communication - See Telephone Encounter >> May 27, 2017  3:53 PM Arletha Grippe wrote: CRM for notification. See Telephone encounter for:   05/27/17.  Pt called - she takes levothyroxine (SYNTHROID, LEVOTHROID) 50 MCG tablet is on long term [employer] television[employer]film[employer]video and pharm does not know when they will have any . Pt needs to have a replacement sent in.  [facility] on [address] [facility] is [phone]

## 2017-05-28 NOTE — Telephone Encounter (Signed)
It's okay for pharmacy to use another manufacturer Need to recheck TSH 6-8 weeks after the change

## 2017-05-28 NOTE — Telephone Encounter (Signed)
See note re: national shortage on Levothyroxine, and request for alternative medication.

## 2017-05-28 NOTE — Telephone Encounter (Signed)
Left pharmacy voicemail that it was okay to change meds.

## 2017-10-11 ENCOUNTER — Telehealth: Payer: Self-pay | Admitting: Family Medicine

## 2017-10-14 NOTE — Telephone Encounter (Signed)
Please ask pt to schedule an appt and come in soon I'll refill her meds and we'll get labs when she comes Thank you

## 2017-10-14 NOTE — Telephone Encounter (Signed)
Spoke with patient @ 2:11 and she scheduled appt for 10-31-17

## 2017-10-31 ENCOUNTER — Ambulatory Visit (INDEPENDENT_AMBULATORY_CARE_PROVIDER_SITE_OTHER): Payer: Self-pay | Admitting: Family Medicine

## 2017-10-31 ENCOUNTER — Encounter: Payer: Self-pay | Admitting: Family Medicine

## 2017-10-31 VITALS — BP 90/70 | HR 70 | Temp 98.3°F | Resp 16 | Ht 63.0 in | Wt 156.2 lb

## 2017-10-31 DIAGNOSIS — Z5181 Encounter for therapeutic drug level monitoring: Secondary | ICD-10-CM

## 2017-10-31 DIAGNOSIS — Z6379 Other stressful life events affecting family and household: Secondary | ICD-10-CM

## 2017-10-31 DIAGNOSIS — Z1239 Encounter for other screening for malignant neoplasm of breast: Secondary | ICD-10-CM

## 2017-10-31 DIAGNOSIS — Z1231 Encounter for screening mammogram for malignant neoplasm of breast: Secondary | ICD-10-CM

## 2017-10-31 DIAGNOSIS — E782 Mixed hyperlipidemia: Secondary | ICD-10-CM

## 2017-10-31 DIAGNOSIS — F330 Major depressive disorder, recurrent, mild: Secondary | ICD-10-CM

## 2017-10-31 DIAGNOSIS — I700 Atherosclerosis of aorta: Secondary | ICD-10-CM

## 2017-10-31 DIAGNOSIS — Z5971 Insufficient health insurance coverage: Secondary | ICD-10-CM

## 2017-10-31 DIAGNOSIS — J432 Centrilobular emphysema: Secondary | ICD-10-CM

## 2017-10-31 DIAGNOSIS — E039 Hypothyroidism, unspecified: Secondary | ICD-10-CM

## 2017-10-31 DIAGNOSIS — Z5989 Other problems related to housing and economic circumstances: Secondary | ICD-10-CM

## 2017-10-31 DIAGNOSIS — R5383 Other fatigue: Secondary | ICD-10-CM

## 2017-10-31 DIAGNOSIS — Z598 Other problems related to housing and economic circumstances: Secondary | ICD-10-CM

## 2017-10-31 DIAGNOSIS — Z87891 Personal history of nicotine dependence: Secondary | ICD-10-CM

## 2017-10-31 LAB — LIPID PANEL
CHOL/HDL RATIO: 3.8 (calc) (ref <5.0)
Cholesterol: 177 mg/dL (ref <200)
HDL: 47 mg/dL — ABNORMAL LOW (ref >50)
LDL Cholesterol (Calc): 109 mg/dL (calc) — ABNORMAL HIGH
Non-HDL Cholesterol (Calc): 130 mg/dL (calc) — ABNORMAL HIGH (ref <130)
Triglycerides: 103 mg/dL (ref <150)

## 2017-10-31 LAB — TSH: TSH: 5.17 mIU/L — ABNORMAL HIGH (ref 0.40–4.50)

## 2017-10-31 NOTE — Assessment & Plan Note (Signed)
So glad she has quit smoking; continue inhaler PRN

## 2017-10-31 NOTE — Assessment & Plan Note (Signed)
Discussed her personal stress right now; suggested counseling; offered medicine

## 2017-10-31 NOTE — Assessment & Plan Note (Signed)
Check lipids today; continue statin; goal LDL less than 70

## 2017-10-31 NOTE — Patient Instructions (Addendum)
- Please eat a few small snacks throughout the day.  - Give yourself some energy by grabbing some yummy vegetables   Try to limit saturated fats in your diet (bologna, hot dogs, barbeque, cheeseburgers, hamburgers, steak, bacon, sausage, cheese, etc.) and get more fresh fruits, vegetables, and whole grains   12 Ways to Curb Anxiety  ?Anxiety is normal human sensation. It is what helped our ancestors survive the pitfalls of the wilderness. Anxiety is defined as experiencing worry or nervousness about an imminent event or something with an uncertain outcome. It is a feeling experienced by most people at some point in their lives. Anxiety can be triggered by a very personal issue, such as the illness of a loved one, or an event of global proportions, such as a refugee crisis. Some of the symptoms of anxiety are:  Feeling restless.  Having a feeling of impending danger.  Increased heart rate.  Rapid breathing. Sweating.  Shaking.  Weakness or feeling tired.  Difficulty concentrating on anything except the current worry.  Insomnia.  Stomach or bowel problems. What can we do about anxiety we may be feeling? There are many techniques to help manage stress and relax. Here are 12 ways you can reduce your anxiety almost immediately: 1. Turn off the constant feed of information. Take a social media sabbatical. Studies have shown that social media directly contributes to social anxiety.  2. Monitor your television viewing habits. Are you watching shows that are also contributing to your anxiety, such as 24-hour news stations? Try watching something else, or better yet, nothing at all. Instead, listen to music, read an inspirational book or practice a hobby. 3. Eat nutritious meals. Also, don't skip meals and keep healthful snacks on hand. Hunger and poor diet contributes to feeling anxious. 4. Sleep. Sleeping on a regular schedule for at least seven to eight hours a night will do wonders for your outlook  when you are awake. 5. Exercise. Regular exercise will help rid your body of that anxious energy and help you get more restful sleep. 6. Try deep (diaphragmatic) breathing. Inhale slowly through your nose for five seconds and exhale through your mouth. 7. Practice acceptance and gratitude. When anxiety hits, accept that there are things out of your control that shouldn't be of immediate concern.  8. Seek out humor. When anxiety strikes, watch a funny video, read jokes or call a friend who makes you laugh. Laughter is healing for our bodies and releases endorphins that are calming. 9. Stay positive. Take the effort to replace negative thoughts with positive ones. Try to see a stressful situation in a positive light. Try to come up with solutions rather than dwelling on the problem. 10. Figure out what triggers your anxiety. Keep a journal and make note of anxious moments and the events surrounding them. This will help you identify triggers you can avoid or even eliminate. 11. Talk to someone. Let a trusted friend, family member or even trained professional know that you are feeling overwhelmed and anxious. Verbalize what you are feeling and why.  12. Volunteer. If your anxiety is triggered by a crisis on a large scale, become an advocate and work to resolve the problem that is causing you unease. Anxiety is often unwelcome and can become overwhelming. If not kept in check, it can become a disorder that could require medical treatment. However, if you take the time to care for yourself and avoid the triggers that make you anxious, you will be able to  find moments of relaxation and clarity that make your life much more enjoyable.   Living With Anxiety After being diagnosed with an anxiety disorder, you may be relieved to know why you have felt or behaved a certain way. It is natural to also feel overwhelmed about the treatment ahead and what it will mean for your life. With care and support, you can manage  this condition and recover from it. How to cope with anxiety Dealing with stress Stress is your body's reaction to life changes and events, both good and bad. Stress can last just a few hours or it can be ongoing. Stress can play a major role in anxiety, so it is important to learn both how to cope with stress and how to think about it differently. Talk with your health care provider or a counselor to learn more about stress reduction. He or she may suggest some stress reduction techniques, such as:  Music therapy. This can include creating or listening to music that you enjoy and that inspires you.  Mindfulness-based meditation. This involves being aware of your normal breaths, rather than trying to control your breathing. It can be done while sitting or walking.  Centering prayer. This is a kind of meditation that involves focusing on a word, phrase, or sacred image that is meaningful to you and that brings you peace.  Deep breathing. To do this, expand your stomach and inhale slowly through your nose. Hold your breath for 3-5 seconds. Then exhale slowly, allowing your stomach muscles to relax.  Self-talk. This is a skill where you identify thought patterns that lead to anxiety reactions and correct those thoughts.  Muscle relaxation. This involves tensing muscles then relaxing them.  Choose a stress reduction technique that fits your lifestyle and personality. Stress reduction techniques take time and practice. Set aside 5-15 minutes a day to do them. Therapists can offer training in these techniques. The training may be covered by some insurance plans. Other things you can do to manage stress include:  Keeping a stress diary. This can help you learn what triggers your stress and ways to control your response.  Thinking about how you respond to certain situations. You may not be able to control everything, but you can control your reaction.  Making time for activities that help you relax,  and not feeling guilty about spending your time in this way.  Therapy combined with coping and stress-reduction skills provides the best chance for successful treatment. Medicines Medicines can help ease symptoms. Medicines for anxiety include:  Anti-anxiety drugs.  Antidepressants.  Beta-blockers.  Medicines may be used as the main treatment for anxiety disorder, along with therapy, or if other treatments are not working. Medicines should be prescribed by a health care provider. Relationships Relationships can play a big part in helping you recover. Try to spend more time connecting with trusted friends and family members. Consider going to couples counseling, taking family education classes, or going to family therapy. Therapy can help you and others better understand the condition. How to recognize changes in your condition Everyone has a different response to treatment for anxiety. Recovery from anxiety happens when symptoms decrease and stop interfering with your daily activities at home or work. This may mean that you will start to:  Have better concentration and focus.  Sleep better.  Be less irritable.  Have more energy.  Have improved memory.  It is important to recognize when your condition is getting worse. Contact your health care provider  if your symptoms interfere with home or work and you do not feel like your condition is improving. Where to find help and support: You can get help and support from these sources:  Self-help groups.  Online and [employer].  A trusted spiritual leader.  Couples counseling.  Family education classes.  Family therapy.  Follow these instructions at home:  Eat a healthy diet that includes plenty of vegetables, fruits, whole grains, low-fat dairy products, and lean protein. Do not eat a lot of foods that are high in solid fats, added sugars, or salt.  Exercise. Most adults should do the following: ? Exercise for  at least 150 minutes each week. The exercise should increase your heart rate and make you sweat (moderate-intensity exercise). ? Strengthening exercises at least twice a week.  Cut down on caffeine, tobacco, alcohol, and other potentially harmful substances.  Get the right amount and quality of sleep. Most adults need 7-9 hours of sleep each night.  Make choices that simplify your life.  Take over-the-counter and prescription medicines only as told by your health care provider.  Avoid caffeine, alcohol, and certain over-the-counter cold medicines. These may make you feel worse. Ask your pharmacist which medicines to avoid.  Keep all follow-up visits as told by your health care provider. This is important. Questions to ask your health care provider  Would I benefit from therapy?  How often should I follow up with a health care provider?  How long do I need to take medicine?  Are there any long-term side effects of my medicine?  Are there any alternatives to taking medicine? Contact a health care provider if:  You have a hard time staying focused or finishing daily tasks.  You spend many hours a day feeling worried about everyday life.  You become exhausted by worry.  You start to have headaches, feel tense, or have nausea.  You urinate more than normal.  You have diarrhea. Get help right away if:  You have a racing heart and shortness of breath.  You have thoughts of hurting yourself or others. If you ever feel like you may hurt yourself or others, or have thoughts about taking your own life, get help right away. You can go to your nearest emergency department or call:  Your local emergency services (911 in the [place]  A suicide crisis helpline, such as the [place] at 707-262-2420. This is open 24-hours a day.  Summary  Taking steps to deal with stress can help calm you.  Medicines cannot cure anxiety disorders, but they can help ease  symptoms.  Family, friends, and partners can play a big part in helping you recover from an anxiety disorder. This information is not intended to replace advice given to you by your health care provider. Make sure you discuss any questions you have with your health care provider. Document Released: 03/13/2016 Document Revised: 03/13/2016 Document Reviewed: 03/13/2016 Elsevier Interactive Patient Education  [employer].

## 2017-10-31 NOTE — Assessment & Plan Note (Signed)
Reviewed last Cr and SGPT; normal; patient does not have insurance and I'll be aware of not running up lab bills for her

## 2017-10-31 NOTE — Assessment & Plan Note (Signed)
Yearly chest CT until she has been quit for 15 years; next due Sept 2019

## 2017-10-31 NOTE — Assessment & Plan Note (Signed)
Goal LDL less than 70; glad she has quit smoking; continue aspirin and statin

## 2017-10-31 NOTE — Assessment & Plan Note (Signed)
May be related to depression, thyroid, stress, etc.

## 2017-10-31 NOTE — Assessment & Plan Note (Signed)
Check TSH today; she has been on 50 mcg daily, did not tolerate higher dose earlier; will see what TSH is today and adjust accordingly; she says she has enough medicine until labs are back

## 2017-10-31 NOTE — Progress Notes (Signed)
BP 90/70 (BP Location: Left Arm, Patient Position: Sitting, Cuff Size: Normal)   Pulse 70   Temp 98.3 F (36.8 C) (Oral)   Resp 16   Ht 5\' 3"  (1.6 m)   Wt 156 lb 3.2 oz (70.9 kg)   SpO2 98%   BMI 27.67 kg/m    Subjective:    Patient ID: Alejandra Mcdaniel, female    DOB: November 22, 1960, 57 y.o.   MRN: [id number]  HPI: Alejandra Mcdaniel is a 57 y.o. female  Chief Complaint  Patient presents with  . Medication Refill    Lipitor     HPI Patient is here for f/u Her blood pressure is low, but she says she feels fine and her BP is always low like this; she is not concerned  Emphysema Lung Patient smoked 2 ppd in the past but switched to the vape 6 years ago- stopped using flavored kind and no longer wheezing. Had albuterol inhaler but hasnt needed it.   Aortic Atherosclerosis & Hyperlipidemia  Taking daily 81mg  aspirin and lipitor 40mg  a night. She says she doesn't eat good- eats once a day and its usually fast food. Gets maybe 2 vegetables a week. Drinks flavored water that is sugar-free and rarely a sip of mellow yellow.  Lab Results  Component Value Date   CHOL 177 10/31/2017   HDL 47 (L) 10/31/2017   LDLCALC 109 (H) 10/31/2017   TRIG 103 10/31/2017   CHOLHDL 3.8 10/31/2017    Hypothyroidism Taking 53mcg of synthroid once a day- was supposed to increase to 1.5 pills 4 days a week but states when she did that had very extreme hot flashes that resolved when you went down. Patient does endorses fatigued; still having some hot flashes but not as bad. Denies bowel changes, hair loss.  Now just doing 50 mcg daily; more than 8 weeks  Lab Results  Component Value Date   TSH 5.17 (H) 10/31/2017   Personal stress; family illness; she has been at the hospital, and person is moving to [place] hx of tobacco use; quit smoking; last CT scan lung Sept 2018:  IMPRESSION: 1. Lung-RADS 1, negative. Continue annual screening with low-dose chest CT without contrast in 12  months. 2.  Emphysema (ICD10-J43.9). 3.  Aortic Atherosclerosis (ICD10-I70.0).   Electronically Signed   By: Abigail Miyamoto M.D.   On: 12/05/2016 15:54  Depression screen [facility] 2/9 10/31/2017 01/07/2017 11/19/2016 10/08/2016 02/13/2016  Decreased Interest 0 0 0 0 0  Down, Depressed, Hopeless 0 0 0 0 0  PHQ - 2 Score 0 0 0 0 0  Altered sleeping 3 - - - -  Tired, decreased energy 3 - - - -  Change in appetite 3 - - - -  Feeling bad or failure about yourself  0 - - - -  Trouble concentrating 0 - - - -  Moving slowly or fidgety/restless 0 - - - -  Suicidal thoughts 0 - - - -  PHQ-9 Score 9 - - - -  Difficult doing work/chores Somewhat difficult - - - -    Relevant past medical, surgical, family and social history reviewed Past Medical History:  Diagnosis Date  . Aortic atherosclerosis ([place]) 01/07/2017   Noted on chest CT 2018  . Dysrhythmia    occasional palpatations (secondary to thyroid disease)  . Emphysema lung ([place]) 01/07/2017   Chest CT 2018  . Hypercholesteremia   . Hyperlipidemia   . Hypothyroidism   . PONV (postoperative  nausea and vomiting)    Past Surgical History:  Procedure Laterality Date  . CESAREAN SECTION  x2  . COLONOSCOPY WITH PROPOFOL N/A 12/03/2014   Procedure: COLONOSCOPY WITH PROPOFOL;  Surgeon: Lucilla Lame, MD;  Location: [facility];  Service: Endoscopy;  Laterality: N/A;  . POLYPECTOMY  12/03/2014   Procedure: POLYPECTOMY;  Surgeon: Lucilla Lame, MD;  Location: [facility];  Service: Endoscopy;;  . TONSILLECTOMY     Family History  Problem Relation Age of Onset  . Heart disease Father   . Diabetes Father   . Hyperlipidemia Father   . Thyroid disease Father   . COPD Sister   . Heart disease Sister   . Diabetes Brother   . Hypertension Brother   . Hepatitis C Brother   . Kidney disease Brother   . Diabetes Sister   . Cancer Mother        cervical  . Heart disease Mother   . Diabetes Mother   . Hyperlipidemia Mother   . Heart  disease Maternal Grandfather   . Cancer Brother        kidney   . Breast cancer Paternal Aunt 41   Social History   Tobacco Use  . Smoking status: Former Smoker    Packs/day: 2.00    Years: 40.00    Pack years: 80.00    Types: Cigarettes    Last attempt to quit: 12/01/2012    Years since quitting: 4.9  . Smokeless tobacco: Never Used  Substance Use Topics  . Alcohol use: No  . Drug use: No    Interim medical history since last visit reviewed. Allergies and medications reviewed  Review of Systems Per HPI unless specifically indicated above     Objective:    BP 90/70 (BP Location: Left Arm, Patient Position: Sitting, Cuff Size: Normal)   Pulse 70   Temp 98.3 F (36.8 C) (Oral)   Resp 16   Ht 5\' 3"  (1.6 m)   Wt 156 lb 3.2 oz (70.9 kg)   SpO2 98%   BMI 27.67 kg/m   Wt Readings from Last 3 Encounters:  10/31/17 156 lb 3.2 oz (70.9 kg)  01/07/17 144 lb 3.2 oz (65.4 kg)  12/05/16 145 lb (65.8 kg)    Physical Exam  Constitutional: She appears well-developed and well-nourished. No distress.  Weight gain 12 pounds over the last 10 months  HENT:  Head: Normocephalic and atraumatic.  Eyes: EOM are normal. No scleral icterus.  Neck: No thyromegaly present.  Cardiovascular: Normal rate, regular rhythm and normal heart sounds.  No murmur heard. Pulmonary/Chest: Effort normal and breath sounds normal. No respiratory distress. She has no wheezes.  Abdominal: Soft. Bowel sounds are normal. She exhibits no distension.  Musculoskeletal: Normal range of motion. She exhibits no edema.  Neurological: She is alert. She exhibits normal muscle tone.  Skin: Skin is warm and dry. She is not diaphoretic. No pallor.  Few scratches on shins c/w puppy marks  Psychiatric: Her behavior is normal. Her mood appears not anxious. She is not agitated and not slowed. She exhibits a depressed mood.  Tearful in front of examiner      Assessment & Plan:   Problem List Items Addressed This Visit       Cardiovascular and Mediastinum   Aortic atherosclerosis ([place]) (Chronic)    Goal LDL less than 70; glad she has quit smoking; continue aspirin and statin      Relevant Orders   Lipid panel (Completed)  Respiratory   Emphysema lung ([facility]) (Chronic)    So glad she has quit smoking; continue inhaler PRN        Endocrine   Hypothyroidism - Primary (Chronic)    Check TSH today; she has been on 50 mcg daily, did not tolerate higher dose earlier; will see what TSH is today and adjust accordingly; she says she has enough medicine until labs are back      Relevant Orders   TSH (Completed)     Other   Hyperlipidemia    Check lipids today; continue statin; goal LDL less than 70      Hx of tobacco use, presenting hazards to health    Yearly chest CT until she has been quit for 15 years; next due Sept 2019      Fatigue    May be related to depression, thyroid, stress, etc.      Encounter for medication monitoring    Reviewed last Cr and SGPT; normal; patient does not have insurance and I'll be aware of not running up lab bills for her      Depression, major, recurrent ([place])    Discussed her personal stress right now; suggested counseling; offered medicine       Other Visit Diagnoses    Breast cancer screening       Relevant Orders   MM DIGITAL SCREENING BILATERAL   Stressful life events affecting family and household       supportive listening   Does not have health insurance       keeping this in mind as we prescribe medicine, order lab tests, etc.; explained she should never go w/o breast imaging, though, lots of resources for that       Follow up plan: Return in about 1 year (around 11/01/2018) for twenty minute follow-up with fasting labs (sooner if needed or circumstances change).  An after-visit summary was printed and given to the patient at [facility].  Please see the patient instructions which may contain other information and recommendations beyond what is  mentioned above in the assessment and plan.  No orders of the defined types were placed in this encounter.   Orders Placed This Encounter  Procedures  . MM DIGITAL SCREENING BILATERAL  . Lipid panel  . TSH

## 2017-11-01 ENCOUNTER — Other Ambulatory Visit: Payer: Self-pay | Admitting: Family Medicine

## 2017-11-01 DIAGNOSIS — E039 Hypothyroidism, unspecified: Secondary | ICD-10-CM

## 2017-11-01 MED ORDER — ATORVASTATIN CALCIUM 40 MG PO TABS: 40.0000 mg | ORAL_TABLET | Freq: Every day | ORAL | 11 refills | Status: DC

## 2017-11-01 MED ORDER — LEVOTHYROXINE SODIUM 50 MCG PO TABS: ORAL_TABLET | ORAL | 1 refills | Status: DC

## 2017-11-01 NOTE — Progress Notes (Signed)
New instructions

## 2017-11-04 ENCOUNTER — Encounter: Payer: Self-pay | Admitting: Family Medicine

## 2017-11-04 MED ORDER — ATORVASTATIN CALCIUM 80 MG PO TABS: 80.0000 mg | ORAL_TABLET | Freq: Every day | ORAL | 3 refills | Status: DC

## 2017-11-04 NOTE — Addendum Note (Signed)
Addended by: LADA, Satira Anis on: 11/04/2017 05:12 PM   Modules accepted: Orders

## 2017-11-04 NOTE — Telephone Encounter (Signed)
Note to patient to verify if truly taking 40 mg every daily for months prior to draw If missing a lot, then back on 40 mg If really compliant, then increase to 80 mg

## 2017-11-23 ENCOUNTER — Telehealth: Payer: Self-pay

## 2017-11-23 NOTE — Telephone Encounter (Signed)
Call pt regarding lung screening unable to leave message. Pt voice mail not setup.

## 2017-11-25 ENCOUNTER — Telehealth: Payer: Self-pay | Admitting: *Deleted

## 2017-11-25 NOTE — Telephone Encounter (Signed)
Attempted to contact patient r/t LDCT Screening follow up due at this time. Attempted to contact patient r/t LDCT Screening follow up due at this time.  No answer received, unable to leave message at this time, will attempt contact at later date.   

## 2017-11-26 ENCOUNTER — Encounter: Payer: Self-pay | Admitting: *Deleted

## 2017-11-26 ENCOUNTER — Telehealth: Payer: Self-pay | Admitting: *Deleted

## 2017-11-26 ENCOUNTER — Other Ambulatory Visit: Payer: Self-pay

## 2017-11-26 DIAGNOSIS — E039 Hypothyroidism, unspecified: Secondary | ICD-10-CM

## 2017-11-26 NOTE — Telephone Encounter (Signed)
Received referral for low dose lung cancer screening CT scan.  Unable to leave message at this time, will attempt call at later point.       

## 2017-11-27 ENCOUNTER — Encounter: Payer: Self-pay | Admitting: Family Medicine

## 2018-01-28 IMAGING — CT CT CHEST LUNG CANCER SCREENING LOW DOSE W/O CM
1 of 2 series · 15 of 40 positions shown, 19 images · non-contrast
Comparison: Plain film 12/02/2015.  No prior CT.

CLINICAL DATA: Ex-smoker, quitting 4 years ago. Eighty pack-year
history. Asymptomatic.

EXAM:
CT CHEST WITHOUT CONTRAST LOW-DOSE FOR LUNG CANCER SCREENING
TECHNIQUE: Multidetector CT imaging of the chest was performed following the
standard protocol without IV contrast.

[Series 2: axial st · axial · 0.67mm/px · z∈[-648,-368]mm · 15 of 62 slices shown, 19 images]
[im 3/62  mediastinal]
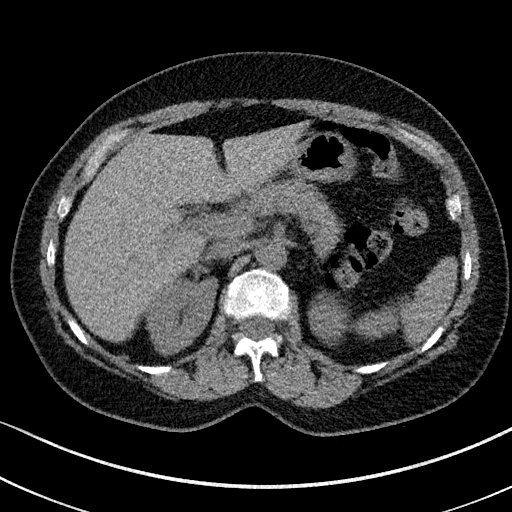
[im 3/62  lung]
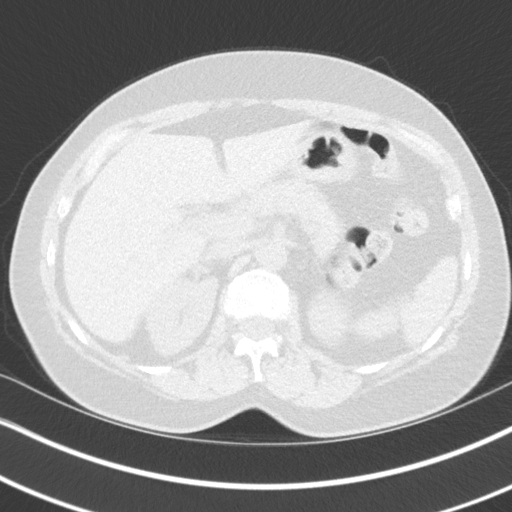
[im 8/62  lung]
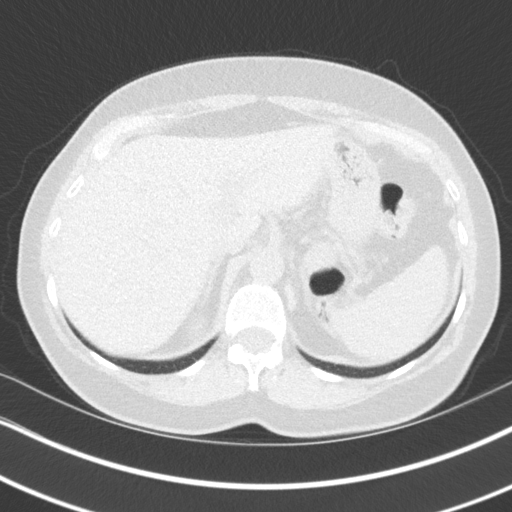
[im 12/62  lung]
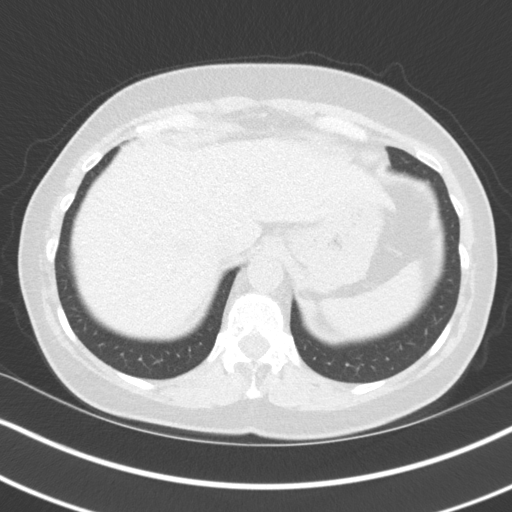
[im 16/62  lung]
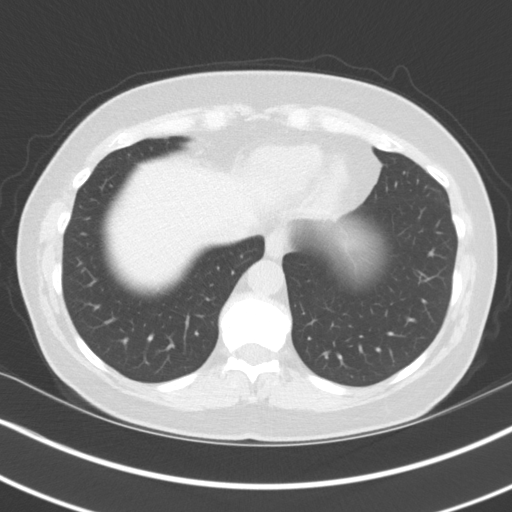
[im 19/62  mediastinal]
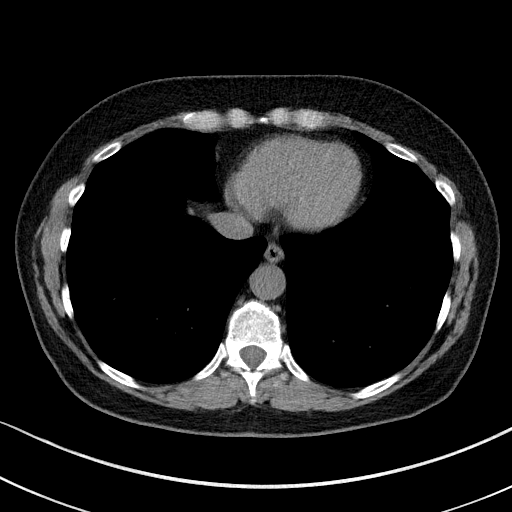
[im 19/62  lung]
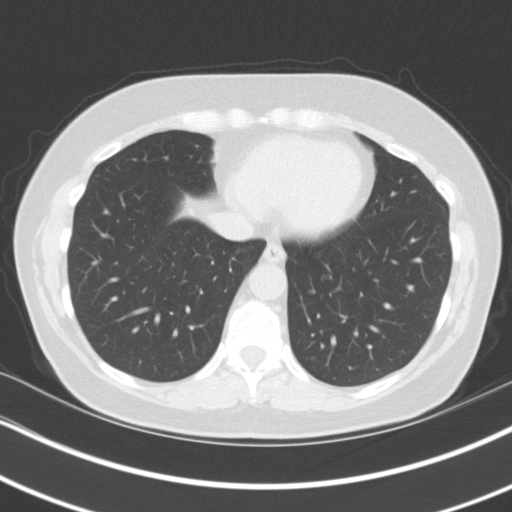
[im 24/62  lung]
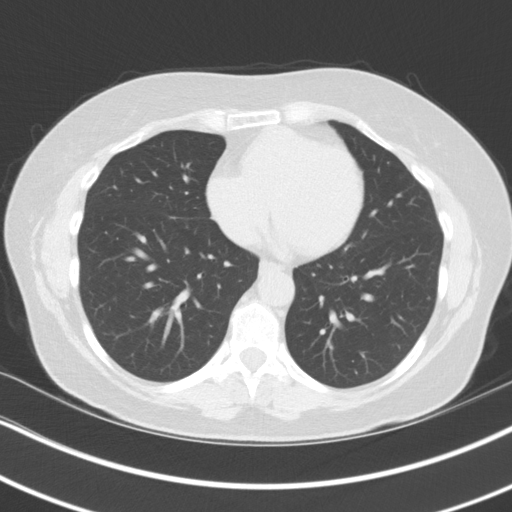
[im 29/62  lung]
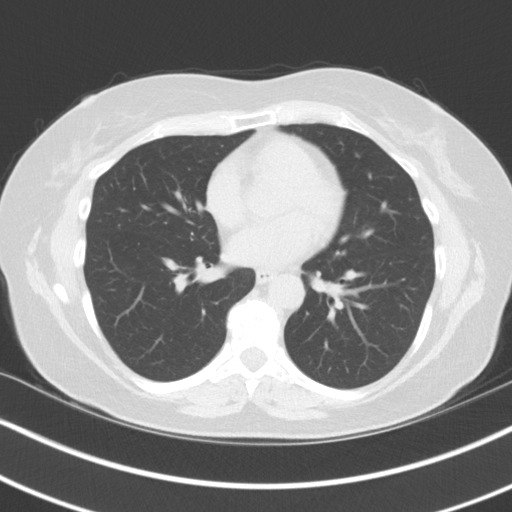
[im 31/62  lung]
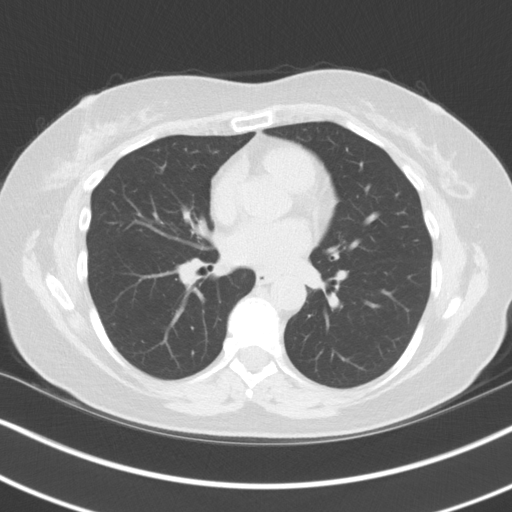
[im 33/62  mediastinal]
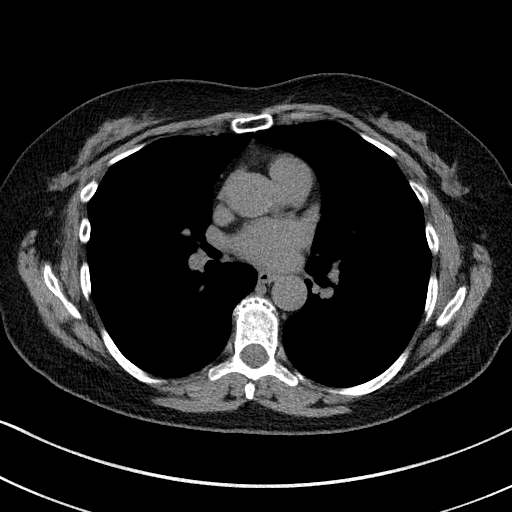
[im 33/62  lung]
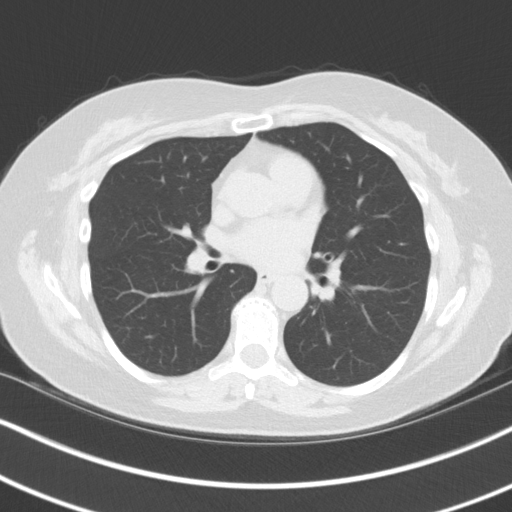
[im 38/62  lung]
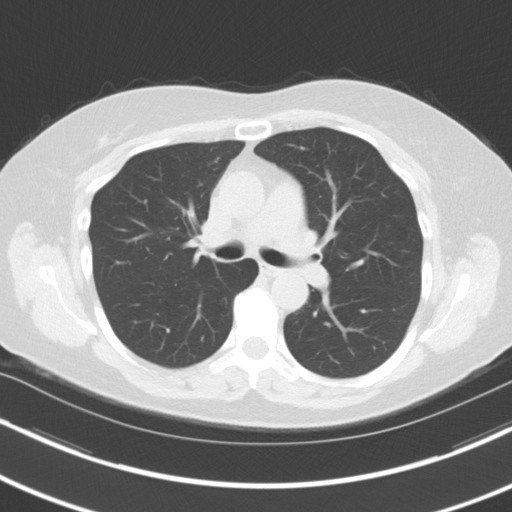
[im 43/62  lung]
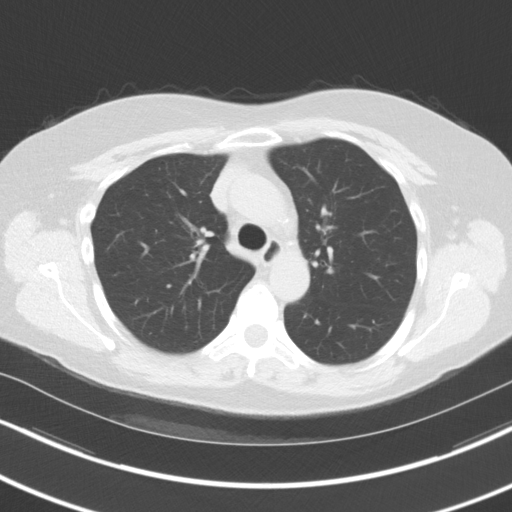
[im 46/62  lung]
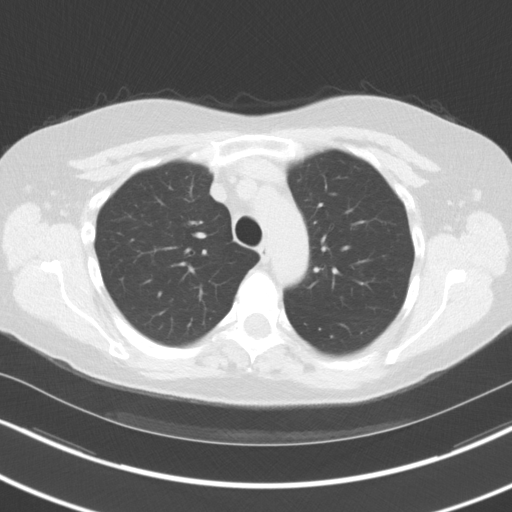
[im 50/62  mediastinal]
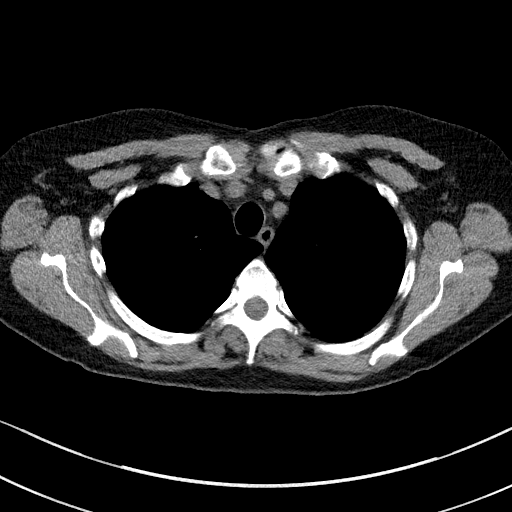
[im 50/62  lung]
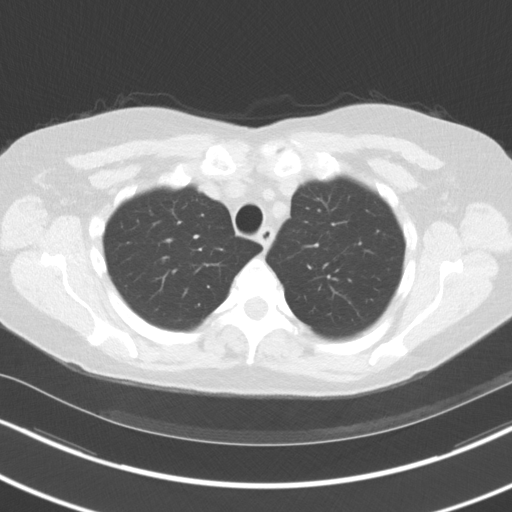
[im 54/62  lung]
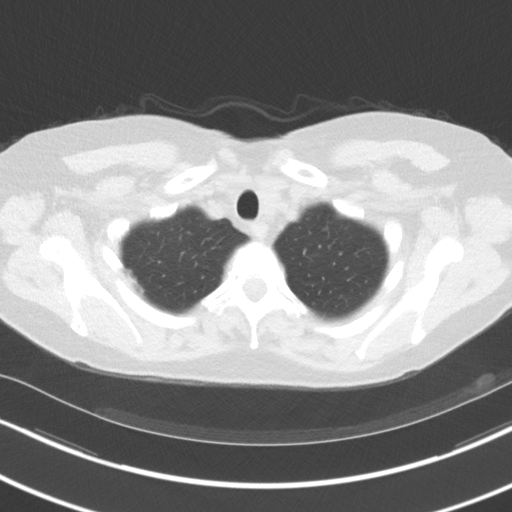
[im 59/62  lung]
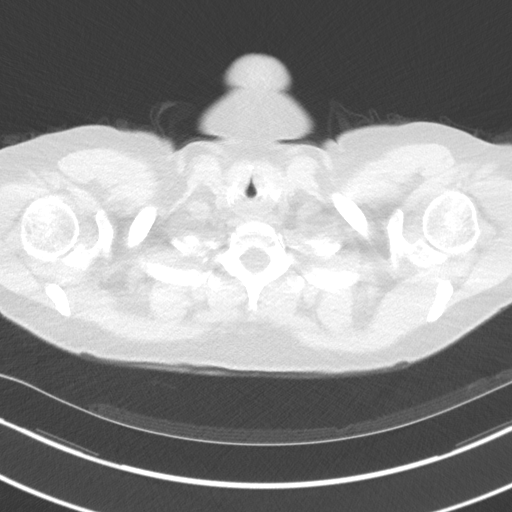

[15 of 40 positions shown; findings below may reference images not displayed]

FINDINGS: Cardiovascular: Aortic atherosclerosis. Tortuous thoracic aorta.
Normal heart size, without pericardial effusion.

Mediastinum/Nodes: No mediastinal or definite hilar adenopathy,
given limitations of unenhanced CT.

Lungs/Pleura: No pleural fluid. Mild centrilobular emphysema. No
suspicious pulmonary nodule or mass.

Upper Abdomen: Normal imaged portions of the liver, spleen, stomach,
pancreas, adrenal glands, kidneys.

Musculoskeletal: Mild thoracic spondylosis.
IMPRESSION: 1. Lung-RADS 1, negative. Continue annual screening with low-dose
chest CT without contrast in 12 months.
2.  Emphysema (8RQM3-G2G.O).
3.  Aortic Atherosclerosis (8RQM3-CB5.5).

## 2018-03-25 ENCOUNTER — Other Ambulatory Visit: Payer: Self-pay | Admitting: Family Medicine

## 2018-03-25 LAB — TSH: TSH: 4.51 m[IU]/L — AB (ref 0.40–4.50)

## 2018-03-25 MED ORDER — LEVOTHYROXINE SODIUM 50 MCG PO TABS: ORAL_TABLET | ORAL | 6 refills | Status: DC

## 2018-09-05 ENCOUNTER — Encounter: Payer: Self-pay | Admitting: Family Medicine

## 2018-10-27 ENCOUNTER — Telehealth: Payer: Self-pay | Admitting: Family Medicine

## 2018-10-27 NOTE — Telephone Encounter (Signed)
Tried calling pt to schedule an appt for med refill

## 2018-10-27 NOTE — Telephone Encounter (Signed)
Please schedule for routine follow-up in one month

## 2018-10-28 ENCOUNTER — Telehealth: Payer: Self-pay

## 2018-10-28 MED ORDER — LEVOTHYROXINE SODIUM 50 MCG PO TABS: ORAL_TABLET | ORAL | 0 refills | Status: DC

## 2018-10-28 NOTE — Telephone Encounter (Signed)
Copied from [place] [id number]. Topic: General - Other >> Oct 28, 2018 12:48 PM Yvette Rack wrote: Reason for CRM: Robin with [facility] called to see if the Rx for levothyroxine (SYNTHROID) 50 MCG tablet could be changed to Euthroxy. Cb# [phone]

## 2018-11-08 ENCOUNTER — Other Ambulatory Visit: Payer: Self-pay | Admitting: Family Medicine

## 2018-12-12 ENCOUNTER — Other Ambulatory Visit: Payer: Self-pay

## 2018-12-12 ENCOUNTER — Telehealth: Payer: Self-pay | Admitting: Family Medicine

## 2018-12-12 NOTE — Telephone Encounter (Signed)
Pt request refill  atorvastatin (LIPITOR) 80 MG tablet  levothyroxine (EUTHYROX) 50 MCG tablet  Pt requesting a lab only appt because she does not have insurance, and cannot afford an office visit and labs both.   [facility] ([facility]), [place] [facility] [place] [phone] (Phone) [phone] (Fax   Ok to message on [place]

## 2018-12-14 MED ORDER — LEVOTHYROXINE SODIUM 50 MCG PO TABS: ORAL_TABLET | ORAL | 0 refills | Status: DC

## 2018-12-14 MED ORDER — ATORVASTATIN CALCIUM 80 MG PO TABS: 80.0000 mg | ORAL_TABLET | Freq: Every day | ORAL | 0 refills | Status: DC

## 2018-12-14 NOTE — Telephone Encounter (Signed)
Please schedule patient for follow up in the next 15 days.  

## 2018-12-15 NOTE — Telephone Encounter (Signed)
LVM TO [facility] APPT MEDS SENT

## 2018-12-29 ENCOUNTER — Ambulatory Visit: Payer: Self-pay | Admitting: Family Medicine

## 2018-12-29 ENCOUNTER — Encounter: Payer: Self-pay | Admitting: Family Medicine

## 2018-12-29 ENCOUNTER — Other Ambulatory Visit: Payer: Self-pay

## 2018-12-29 VITALS — BP 116/74 | HR 73 | Temp 98.3°F | Resp 14 | Ht 63.0 in | Wt 161.8 lb

## 2018-12-29 DIAGNOSIS — E782 Mixed hyperlipidemia: Secondary | ICD-10-CM

## 2018-12-29 DIAGNOSIS — J302 Other seasonal allergic rhinitis: Secondary | ICD-10-CM

## 2018-12-29 DIAGNOSIS — J432 Centrilobular emphysema: Secondary | ICD-10-CM

## 2018-12-29 DIAGNOSIS — E039 Hypothyroidism, unspecified: Secondary | ICD-10-CM

## 2018-12-29 DIAGNOSIS — E559 Vitamin D deficiency, unspecified: Secondary | ICD-10-CM

## 2018-12-29 DIAGNOSIS — I700 Atherosclerosis of aorta: Secondary | ICD-10-CM

## 2018-12-29 LAB — COMPLETE METABOLIC PANEL WITH GFR
AG Ratio: 1.9 (calc) (ref 1.0–2.5)
ALT: 15 U/L (ref 6–29)
AST: 14 U/L (ref 10–35)
Albumin: 4.2 g/dL (ref 3.6–5.1)
Alkaline phosphatase (APISO): 68 U/L (ref 37–153)
BUN: 10 mg/dL (ref 7–25)
CO2: 30 mmol/L (ref 20–32)
Calcium: 9.4 mg/dL (ref 8.6–10.4)
Chloride: 106 mmol/L (ref 98–110)
Creat: 0.77 mg/dL (ref 0.50–1.05)
GFR, Est African American: 99 mL/min/{1.73_m2} (ref >=60)
GFR, Est Non African American: 85 mL/min/{1.73_m2} (ref >=60)
Globulin: 2.2 g/dL (calc) (ref 1.9–3.7)
Glucose, Bld: 115 mg/dL — ABNORMAL HIGH (ref 65–99)
Potassium: 4.3 mmol/L (ref 3.5–5.3)
Sodium: 143 mmol/L (ref 135–146)
Total Bilirubin: 0.8 mg/dL (ref 0.2–1.2)
Total Protein: 6.4 g/dL (ref 6.1–8.1)

## 2018-12-29 LAB — TSH: TSH: 4.52 mIU/L — ABNORMAL HIGH (ref 0.40–4.50)

## 2018-12-29 LAB — LIPID PANEL
Cholesterol: 162 mg/dL (ref <200)
HDL: 48 mg/dL — ABNORMAL LOW (ref >=50)
LDL Cholesterol (Calc): 96 mg/dL (calc)
Non-HDL Cholesterol (Calc): 114 mg/dL (calc) (ref <130)
Total CHOL/HDL Ratio: 3.4 (calc) (ref <5.0)
Triglycerides: 85 mg/dL (ref <150)

## 2018-12-29 MED ORDER — LEVOTHYROXINE SODIUM 50 MCG PO TABS: ORAL_TABLET | ORAL | 2 refills | Status: DC

## 2018-12-29 MED ORDER — CETIRIZINE HCL 10 MG PO TABS: 10.0000 mg | ORAL_TABLET | Freq: Every day | ORAL | 11 refills | Status: DC | PRN

## 2018-12-29 NOTE — Progress Notes (Signed)
Name: Alejandra Mcdaniel   MRN: [id number]    DOB: 06/06/1960   Date:12/29/2018       Progress Note  Chief Complaint  Patient presents with  . Medication Refill  . Follow-up  . Hyperlipidemia  . Hypothyroidism     Subjective:   Alejandra Mcdaniel is a 58 y.o. female, presents to clinic for routine follow up on the conditions listed above.  Hyperlipidemia:  Current Medication Regimen:  lipitor 80 mg  Last Lipids: Lab Results  Component Value Date   CHOL 177 10/31/2017   HDL 47 (L) 10/31/2017   LDLCALC 109 (H) 10/31/2017   TRIG 103 10/31/2017   CHOLHDL 3.8 10/31/2017   - Current Diet: no diet efforts - Denies: Chest pain, shortness of breath, myalgias.  - Documented aortic atherosclerosis? Yes - Risk factors for atherosclerosis: hypercholesterolemia and smoking  Hypothyroidism Current symptoms: shes stayed tired ever since her dx about 30 years ago . Patient denies change in energy level, diarrhea, heat / cold intolerance, nervousness, palpitations and weight changes. Symptoms have stabilized. Currently taking 50 mcg every day except takes 1 1/2 tabs (75 mcg) Su and Wed Wt Readings from Last 5 Encounters:  12/29/18 161 lb 12.8 oz (73.4 kg)  10/31/17 156 lb 3.2 oz (70.9 kg)  01/07/17 144 lb 3.2 oz (65.4 kg)  12/05/16 145 lb (65.8 kg)  11/19/16 146 lb 14.4 oz (66.6 kg)   BMI Readings from Last 5 Encounters:  12/29/18 28.66 kg/m  10/31/17 27.67 kg/m  01/07/17 25.54 kg/m  12/05/16 25.69 kg/m  11/19/16 26.85 kg/m     Patient Active Problem List   Diagnosis Date Noted  . Nicotine abuse 01/16/2017  . Aortic atherosclerosis ([place]) 01/07/2017  . Emphysema lung ([place]) 01/07/2017  . Hx of tobacco use, presenting hazards to health 11/19/2016  . Fatigue 10/08/2016  . Depression, major, recurrent ([place]) 02/13/2016  . Benign neoplasm of descending colon   . Benign neoplasm of sigmoid colon   . Electronic cigarette use 10/31/2014  . Hyperlipidemia   . Hypothyroidism    . Encounter for medication monitoring 10/11/2014    Past Surgical History:  Procedure Laterality Date  . CESAREAN SECTION  x2  . COLONOSCOPY WITH PROPOFOL N/A 12/03/2014   Procedure: COLONOSCOPY WITH PROPOFOL;  Surgeon: Lucilla Lame, MD;  Location: [facility];  Service: Endoscopy;  Laterality: N/A;  . POLYPECTOMY  12/03/2014   Procedure: POLYPECTOMY;  Surgeon: Lucilla Lame, MD;  Location: [facility];  Service: Endoscopy;;  . TONSILLECTOMY      Family History  Problem Relation Age of Onset  . Heart disease Father   . Diabetes Father   . Hyperlipidemia Father   . Thyroid disease Father   . COPD Sister   . Heart disease Sister   . Diabetes Brother   . Hypertension Brother   . Hepatitis C Brother   . Kidney disease Brother   . Diabetes Sister   . Cancer Mother        cervical  . Heart disease Mother   . Diabetes Mother   . Hyperlipidemia Mother   . Heart disease Maternal Grandfather   . Cancer Brother        kidney   . Breast cancer Paternal Aunt 47    Social History   Socioeconomic History  . Marital status: Divorced    Spouse name: Not on file  . Number of children: Not on file  . Years of education: Not on file  . Highest  education level: Not on file  Occupational History  . Not on file  Social Needs  . Financial resource strain: Not on file  . Food insecurity    Worry: Not on file    Inability: Not on file  . Transportation needs    Medical: Not on file    Non-medical: Not on file  Tobacco Use  . Smoking status: Former Smoker    Packs/day: 2.00    Years: 40.00    Pack years: 80.00    Types: Cigarettes    Quit date: 12/01/2012    Years since quitting: 6.0  . Smokeless tobacco: Never Used  Substance and Sexual Activity  . Alcohol use: No  . Drug use: No  . Sexual activity: Not Currently  Lifestyle  . Physical activity    Days per week: Not on file    Minutes per session: Not on file  . Stress: Not on file  Relationships  . Social  Herbalist on phone: Not on file    Gets together: Not on file    Attends religious service: Not on file    Active member of club or organization: Not on file    Attends meetings of clubs or organizations: Not on file    Relationship status: Not on file  . Intimate partner violence    Fear of current or ex partner: Not on file    Emotionally abused: Not on file    Physically abused: Not on file    Forced sexual activity: Not on file  Other Topics Concern  . Not on file  Social History Narrative   ** Merged History Encounter **         Current Outpatient Medications:  .  aspirin 81 MG tablet, Take 81 mg by mouth daily., Disp: , Rfl:  .  atorvastatin (LIPITOR) 80 MG tablet, Take 1 tablet (80 mg total) by mouth daily. NEED APPT, Disp: 15 tablet, Rfl: 0 .  levothyroxine (EUTHYROX) 50 MCG tablet, TAKE 1 WHOLE PILL BY MOUTH 5 DAYS A WEEK.  TAKE 1 1/2 (ONE AND ONE-HALF) JUST TWO DAYS A WEEK. (WED AND THURS), Disp: 15 tablet, Rfl: 0 .  albuterol (PROVENTIL HFA;VENTOLIN HFA) 108 (90 Base) MCG/ACT inhaler, Inhale 2 puffs into the lungs every 6 (six) hours as needed for wheezing or shortness of breath. (Patient not taking: Reported on 12/29/2018), Disp: 1 Inhaler, Rfl: 2 .  cetirizine (ZYRTEC) 10 MG tablet, Take 1 tablet (10 mg total) by mouth daily as needed for allergies. (Patient not taking: Reported on 12/29/2018), Disp: 30 tablet, Rfl: 11 .  Vitamin D, Ergocalciferol, (DRISDOL) 50000 units CAPS capsule, Take 50,000 Units by mouth every Wednesday., Disp: , Rfl:   Allergies  Allergen Reactions  . Lexapro [Escitalopram Oxalate] Other (See Comments)    I personally reviewed active problem list, medication list, allergies, family history, social history, health maintenance, notes from last encounter, lab results with the patient/caregiver today.  Review of Systems  Constitutional: Negative.   HENT: Negative.   Eyes: Negative.   Respiratory: Negative.   Cardiovascular: Negative.    Gastrointestinal: Negative.   Endocrine: Negative.   Genitourinary: Negative.   Musculoskeletal: Negative.   Skin: Negative.   Allergic/Immunologic: Negative.   Neurological: Negative.   Hematological: Negative.   Psychiatric/Behavioral: Negative.   All other systems reviewed and are negative.    Objective:    Vitals:   12/29/18 1056  BP: 116/74  Pulse: 73  Resp: 14  Temp:  98.3 F (36.8 C)  SpO2: 99%  Weight: 161 lb 12.8 oz (73.4 kg)  Height: 5\' 3"  (1.6 m)    Body mass index is 28.66 kg/m.  Physical Exam Vitals signs and nursing note reviewed.  Constitutional:      General: She is not in acute distress.    Appearance: Normal appearance. She is well-developed. She is not ill-appearing, toxic-appearing or diaphoretic.     Interventions: Face mask in place.  HENT:     Head: Normocephalic and atraumatic.     Right Ear: External ear normal.     Left Ear: External ear normal.  Eyes:     General: Lids are normal. No scleral icterus.       Right eye: No discharge.        Left eye: No discharge.     Conjunctiva/sclera: Conjunctivae normal.  Neck:     Musculoskeletal: Normal range of motion and neck supple.     Trachea: Phonation normal. No tracheal deviation.  Cardiovascular:     Rate and Rhythm: Normal rate and regular rhythm.     Pulses: Normal pulses.          Radial pulses are 2+ on the right side and 2+ on the left side.       Posterior tibial pulses are 2+ on the right side and 2+ on the left side.     Heart sounds: Normal heart sounds. No murmur. No friction rub. No gallop.   Pulmonary:     Effort: Pulmonary effort is normal. No respiratory distress.     Breath sounds: Normal breath sounds. No stridor. No wheezing, rhonchi or rales.  Chest:     Chest wall: No tenderness.  Abdominal:     General: Bowel sounds are normal. There is no distension.     Palpations: Abdomen is soft.     Tenderness: There is no abdominal tenderness. There is no guarding or  rebound.  Musculoskeletal: Normal range of motion.        General: No deformity.     Right lower leg: No edema.     Left lower leg: No edema.  Lymphadenopathy:     Cervical: No cervical adenopathy.  Skin:    General: Skin is warm and dry.     Capillary Refill: Capillary refill takes less than 2 seconds.     Coloration: Skin is not jaundiced or pale.     Findings: No rash.  Neurological:     Mental Status: She is alert.     Motor: No abnormal muscle tone.     Gait: Gait normal.  Psychiatric:        Speech: Speech normal.        Behavior: Behavior normal.        PHQ2/9: Depression screen [facility] 2/9 12/29/2018 10/31/2017 01/07/2017 11/19/2016 10/08/2016  Decreased Interest 0 0 0 0 0  Down, Depressed, Hopeless 0 0 0 0 0  PHQ - 2 Score 0 0 0 0 0  Altered sleeping 0 3 - - -  Tired, decreased energy 0 3 - - -  Change in appetite 0 3 - - -  Feeling bad or failure about yourself  0 0 - - -  Trouble concentrating 0 0 - - -  Moving slowly or fidgety/restless 0 0 - - -  Suicidal thoughts 0 0 - - -  PHQ-9 Score 0 9 - - -  Difficult doing work/chores Not difficult at all Somewhat difficult - - -  phq 9 is positive, reviewed  Fall Risk: Fall Risk  12/29/2018 10/31/2017 01/07/2017 11/19/2016 10/08/2016  Falls in the past year? 0 No No No No  Number falls in past yr: 0 - - - -  Injury with Fall? 0 - - - -    Functional Status Survey: Is the patient deaf or have difficulty hearing?: No Does the patient have difficulty seeing, even when wearing glasses/contacts?: No Does the patient have difficulty concentrating, remembering, or making decisions?: No Does the patient have difficulty walking or climbing stairs?: No Does the patient have difficulty dressing or bathing?: No Does the patient have difficulty doing errands alone such as visiting a doctor's office or shopping?: No    Assessment & Plan:   1. Mixed hyperlipidemia Compliant with meds, no labs since dose increase from 40 mg to 80 mg  daily, rechecking labs, discussed diet and exercise efforts to help health and cholesterol - COMPLETE METABOLIC PANEL WITH GFR - Lipid panel  2. Hypothyroidism, unspecified type Sx well controlled, recheck labs, refilled with amount per [email or web address] for cash pay - TSH - levothyroxine (EUTHYROX) 50 MCG tablet; Take 1 tab (50 mcg) PO daily except on Sun and Wed take 1.5 tabs (75 mcg) PO daily before breakfast  Dispense: 90 tablet; Refill: 2  3. Centrilobular emphysema ([facility]) No SOB, wheeze, cough Quit smoking, now vaping, encouraged to wean off/decrease or quit if able for her lung health  4. Aortic atherosclerosis ([facility]) On ASA and statin  5. Vitamin D deficiency Low labs in the past with Rx supplement called in, she completed and is now taking nothing, encouraged her to take daily Vit D 3 and calcium - defer rechecking labs due to cost  6. Seasonal allergies Well controlled with antihistamine - she requested refill, cheaper than OTC - cetirizine (ZYRTEC) 10 MG tablet; Take 1 tablet (10 mg total) by mouth daily as needed for allergies.  Dispense: 30 tablet; Refill: 11    Return in about 1 year (around 12/29/2019) for Routine follow-up.   Delsa Grana, PA-C 12/29/18 11:04 AM

## 2018-12-29 NOTE — Patient Instructions (Signed)
Supplement daily with calcium and Vit D 3 1200 mg and 1000 IU Should be on this daily after menopause to have healthy bones

## 2018-12-30 NOTE — Telephone Encounter (Signed)
Left pt detailed voicemail.

## 2018-12-30 NOTE — Telephone Encounter (Signed)
Copied from [place] [phone]. Topic: General - Other >> Dec 30, 2018 11:06 AM Celene Kras A wrote: Reason for CRM: Pt called stating the levothyroxine that was sent in for her yesterday was $38. Pt states the pharmacy told her the medication could be $10 if another brand was sent in for her. The pharmacy advised her she would have to get blood work done again in order to get the brand changed and pt just had blood work done yesterday. Please advise.

## 2019-01-19 ENCOUNTER — Telehealth: Payer: Self-pay | Admitting: Family Medicine

## 2019-01-19 MED ORDER — ATORVASTATIN CALCIUM 80 MG PO TABS: 80.0000 mg | ORAL_TABLET | Freq: Every day | ORAL | 0 refills | Status: DC

## 2019-01-19 NOTE — Telephone Encounter (Signed)
Copied from [place] [id number]. Topic: Quick Communication - Rx Refill/Question >> Jan 19, 2019 11:58 AM Leward Quan A wrote: Medication: atorvastatin (LIPITOR) 80 MG tablet   90 day supply requested  Has the patient contacted their pharmacy? Yes.   (Agent: If no, request that the patient contact the pharmacy for the refill.) (Agent: If yes, when and what did the pharmacy advise?)  Preferred Pharmacy (with phone number or street name): [facility] ([facility] [place] - [place] [phone] (Phone) [phone] (Fax)    Agent: Please be advised that RX refills may take up to 3 business days. We ask that you follow-up with your pharmacy.

## 2019-02-04 NOTE — Telephone Encounter (Addendum)
Pt did not get her rx atorvastatin (LIPITOR) 80 MG tablet  Pt had appt on 12/29/18, and had labs at that time. Pt states no reason she needs another appt.  Pt states her other med was sent in that day.  Pt would like a 90 day supply sent to  [facility] ([facility] [place] - [place] [phone] (Phone) [phone] (Fax)

## 2019-02-05 ENCOUNTER — Other Ambulatory Visit: Payer: Self-pay | Admitting: Family Medicine

## 2019-02-05 MED ORDER — ATORVASTATIN CALCIUM 80 MG PO TABS: 80.0000 mg | ORAL_TABLET | Freq: Every day | ORAL | 3 refills | Status: DC

## 2019-07-24 ENCOUNTER — Telehealth: Payer: Self-pay

## 2019-07-24 ENCOUNTER — Other Ambulatory Visit: Payer: Self-pay

## 2019-07-24 DIAGNOSIS — J302 Other seasonal allergic rhinitis: Secondary | ICD-10-CM

## 2019-07-24 DIAGNOSIS — E039 Hypothyroidism, unspecified: Secondary | ICD-10-CM

## 2019-07-24 MED ORDER — LEVOTHYROXINE SODIUM 50 MCG PO TABS: ORAL_TABLET | ORAL | 1 refills | Status: DC

## 2019-07-24 MED ORDER — ATORVASTATIN CALCIUM 80 MG PO TABS: 80.0000 mg | ORAL_TABLET | Freq: Every day | ORAL | 1 refills | Status: DC

## 2019-07-24 MED ORDER — CETIRIZINE HCL 10 MG PO TABS: 10.0000 mg | ORAL_TABLET | Freq: Every day | ORAL | 1 refills | Status: DC | PRN

## 2019-07-24 NOTE — Telephone Encounter (Signed)
Pt will need appointment for any refills

## 2019-07-24 NOTE — Telephone Encounter (Signed)
Look further in chart lesia said to follow-up in 43yr so not due for an appt til sept.  Rx's refilled for 6 months and sent to new pharmacy [facility]

## 2019-07-24 NOTE — Telephone Encounter (Signed)
Patient declined to schedule appointment until she speaks with a nurse first.

## 2019-07-24 NOTE — Telephone Encounter (Signed)
lvm to sch appt for medrefill per Lucio Edward

## 2019-07-27 ENCOUNTER — Other Ambulatory Visit: Payer: Self-pay

## 2019-10-07 ENCOUNTER — Other Ambulatory Visit: Payer: Self-pay

## 2019-10-07 ENCOUNTER — Encounter: Payer: Self-pay | Admitting: Internal Medicine

## 2019-10-07 ENCOUNTER — Ambulatory Visit: Payer: BC Managed Care – PPO | Admitting: Internal Medicine

## 2019-10-07 VITALS — BP 122/74 | HR 63 | Temp 97.9°F | Resp 16 | Ht 62.0 in | Wt 168.9 lb

## 2019-10-07 DIAGNOSIS — L299 Pruritus, unspecified: Secondary | ICD-10-CM | POA: Diagnosis not present

## 2019-10-07 DIAGNOSIS — B369 Superficial mycosis, unspecified: Secondary | ICD-10-CM | POA: Diagnosis not present

## 2019-10-07 MED ORDER — KETOCONAZOLE 2 % EX CREA: 1.0000 "application " | TOPICAL_CREAM | Freq: Two times a day (BID) | CUTANEOUS | 0 refills | Status: AC

## 2019-10-07 NOTE — Progress Notes (Signed)
Patient ID: Alejandra Mcdaniel, female    DOB: 1961-03-18, 59 y.o.   MRN: [id number]  PCP: Alejandra Grana, PA-C  Chief Complaint  Patient presents with  . Rash    Rash started last night around 7:30-8 at work, very itchy, burns    Subjective:   Alejandra Mcdaniel is a 59 y.o. female, presents to clinic with CC of the following:  Chief Complaint  Patient presents with  . Rash    Rash started last night around 7:30-8 at work, very itchy, burns    HPI:  Patient is a 59 year old female patient of Alejandra Mcdaniel She notes that a rash started last evening, noticed at work, and was itchy, stings at times. Redness noted between the legs, extends between the lower vagina and the rectal area. She denies any new contacts to this area No vaginal discharge No new products topically  No new exposures noted No doc'ed fevers, has not felt feverish, not feeling ill Has not applied anything topical No h/o this in past,      Patient Active Problem List   Diagnosis Date Noted  . Nicotine abuse 01/16/2017  . Aortic atherosclerosis ([place]) 01/07/2017  . Emphysema lung ([place]) 01/07/2017  . Hx of tobacco use, presenting hazards to health 11/19/2016  . Fatigue 10/08/2016  . Depression, major, recurrent ([place]) 02/13/2016  . Benign neoplasm of descending colon   . Benign neoplasm of sigmoid colon   . Electronic cigarette use 10/31/2014  . Hyperlipidemia   . Hypothyroidism   . Encounter for medication monitoring 10/11/2014      Current Outpatient Medications:  .  aspirin 81 MG tablet, Take 81 mg by mouth daily., Disp: , Rfl:  .  atorvastatin (LIPITOR) 80 MG tablet, Take 1 tablet (80 mg total) by mouth at bedtime., Disp: 90 tablet, Rfl: 1 .  cetirizine (ZYRTEC) 10 MG tablet, Take 1 tablet (10 mg total) by mouth daily as needed for allergies., Disp: 90 tablet, Rfl: 1 .  levothyroxine (EUTHYROX) 50 MCG tablet, Take 1 tab (50 mcg) PO daily except on Sun and Wed take 1.5 tabs (75 mcg) PO daily before  breakfast, Disp: 90 tablet, Rfl: 1   Allergies  Allergen Reactions  . Lexapro [Escitalopram Oxalate] Other (See Comments)     Past Surgical History:  Procedure Laterality Date  . CESAREAN SECTION  x2  . COLONOSCOPY WITH PROPOFOL N/A 12/03/2014   Procedure: COLONOSCOPY WITH PROPOFOL;  Surgeon: Lucilla Lame, MD;  Location: [facility];  Service: Endoscopy;  Laterality: N/A;  . POLYPECTOMY  12/03/2014   Procedure: POLYPECTOMY;  Surgeon: Lucilla Lame, MD;  Location: [facility];  Service: Endoscopy;;  . TONSILLECTOMY       Family History  Problem Relation Age of Onset  . Heart disease Father   . Diabetes Father   . Hyperlipidemia Father   . Thyroid disease Father   . COPD Sister   . Heart disease Sister   . Diabetes Brother   . Hypertension Brother   . Hepatitis C Brother   . Kidney disease Brother   . Diabetes Sister   . Cancer Mother        cervical  . Heart disease Mother   . Diabetes Mother   . Hyperlipidemia Mother   . Heart disease Maternal Grandfather   . Cancer Brother        kidney   . Breast cancer Paternal Aunt 81     Social History   Tobacco Use  .  Smoking status: Former Smoker    Packs/day: 2.00    Years: 40.00    Pack years: 80.00    Types: Cigarettes    Quit date: 12/01/2012    Years since quitting: 6.8  . Smokeless tobacco: Never Used  Substance Use Topics  . Alcohol use: No    With staff assistance, above reviewed with the patient  today.  ROS: As per HPI, otherwise no specific complaints on a limited and focused system review   No results found for this or any previous visit (from the past 72 hour(s)).   PHQ2/9: Depression screen [facility] 2/9 10/07/2019 12/29/2018 10/31/2017 01/07/2017 11/19/2016  Decreased Interest 0 0 0 0 0  Down, Depressed, Hopeless 0 0 0 0 0  PHQ - 2 Score 0 0 0 0 0  Altered sleeping 0 0 3 - -  Tired, decreased energy 0 0 3 - -  Change in appetite 0 0 3 - -  Feeling bad or failure about yourself  0 0 0 - -    Trouble concentrating 0 0 0 - -  Moving slowly or fidgety/restless 0 0 0 - -  Suicidal thoughts 0 0 0 - -  PHQ-9 Score 0 0 9 - -  Difficult doing work/chores Not difficult at all Not difficult at all Somewhat difficult - -   PHQ-2/9 Result is neg  Fall Risk: Fall Risk  10/07/2019 12/29/2018 10/31/2017 01/07/2017 11/19/2016  Falls in the past year? 0 0 No No No  Number falls in past yr: 0 0 - - -  Injury with Fall? 0 0 - - -      Objective:   Vitals:   10/07/19 1149  BP: 122/74  Pulse: 63  Resp: 16  Temp: 97.9 F (36.6 C)  TempSrc: Temporal  SpO2: 99%  Weight: 168 lb 14.4 oz (76.6 kg)  Height: 5\' 2"  (1.575 m)    Body mass index is 30.89 kg/m.  Physical Exam  Alejandra Mcdaniel was an escort for the examination today NAD, masked, pleasant, not ill-appearing, mildly obese HEENT - [place]/AT, sclera anicteric, Skin-erythematous region was present from the right and left groin area towards the lower vagina and the area between the lower vagina and rectum.  There was no perianal lesions otherwise.  It did not extend into the vaginal region, there was no vaginal discharge.  No ulcerations, no marked scaling, no raised component to the lesion, no blistering.  Not tender to palpate. Neuro/psychiatric - affect was not flat, appropriate with conversation  Alert with speech normal    Results for orders placed or performed in visit on 12/29/18  COMPLETE METABOLIC PANEL WITH GFR  Result Value Ref Range   Glucose, Bld 115 (H) 65 - 99 mg/dL   BUN 10 7 - 25 mg/dL   Creat 0.77 0.50 - 1.05 mg/dL   GFR, Est Non African American 85 > OR = 60 mL/min/1.45m2   GFR, Est African American 99 > OR = 60 mL/min/1.79m2   BUN/Creatinine Ratio NOT APPLICABLE 6 - 22 (calc)   Sodium 143 135 - 146 mmol/L   Potassium 4.3 3.5 - 5.3 mmol/L   Chloride 106 98 - 110 mmol/L   CO2 30 20 - 32 mmol/L   Calcium 9.4 8.6 - 10.4 mg/dL   Total Protein 6.4 6.1 - 8.1 g/dL   Albumin 4.2 3.6 - 5.1 g/dL   Globulin 2.2 1.9 - 3.7 g/dL  (calc)   AG Ratio 1.9 1.0 - 2.5 (calc)   Total Bilirubin  0.8 0.2 - 1.2 mg/dL   Alkaline phosphatase (APISO) 68 37 - 153 U/L   AST 14 10 - 35 U/L   ALT 15 6 - 29 U/L  Lipid panel  Result Value Ref Range   Cholesterol 162 <200 mg/dL   HDL 48 (L) > OR = 50 mg/dL   Triglycerides 85 <150 mg/dL   LDL Cholesterol (Calc) 96 mg/dL (calc)   Total CHOL/HDL Ratio 3.4 <5.0 (calc)   Non-HDL Cholesterol (Calc) 114 <130 mg/dL (calc)  TSH  Result Value Ref Range   TSH 4.52 (H) 0.40 - 4.50 mIU/L       Assessment & Plan:   1. Fungal dermatitis/Itch Discussed that I do think a fungal source is likely to her present rash, with the recent very warm weather likely contributing as well. Recommended a ketoconazole cream to apply topically twice daily Also can use a topical 1% hydrocortisone product sparingly to the area as needed for itch, and emphasized caution with keeping this external to the vagina and rectal region. Also can use a Benadryl product as needed for itch, 25 mg during the daytime hours every 4-6 hours, and can take 50 mg at bedtime.  Did note that it may make drowsy often limiting the daytime use likely. Keeping the area clean and dry is important as well, and to follow-up if not improving or worsening as discussed today.  - ketoconazole (NIZORAL) 2 % cream; Apply 1 application topically 2 (two) times daily for 14 days.  Dispense: 30 g; Refill: 0         Alejandra Mcdaniel D Amandalee Lacap, MD 10/07/19 12:20 PM

## 2019-10-07 NOTE — Patient Instructions (Signed)
A topical antifungal cream was prescribed to your pharmacy  Can also apply a topical 1% hydrocortisone cream sparingly as needed for the itch  Also can use a Benadryl product as needed for itch, up to 50 mg at bedtime, can take 25 mg every 4-6 hours during the day, although may make drowsy and need to be careful with daytime use.

## 2019-10-09 ENCOUNTER — Ambulatory Visit: Payer: Self-pay | Admitting: *Deleted

## 2019-10-09 ENCOUNTER — Telehealth (INDEPENDENT_AMBULATORY_CARE_PROVIDER_SITE_OTHER): Payer: BC Managed Care – PPO | Admitting: Family Medicine

## 2019-10-09 ENCOUNTER — Telehealth: Payer: Self-pay | Admitting: Family Medicine

## 2019-10-09 ENCOUNTER — Encounter: Payer: Self-pay | Admitting: Family Medicine

## 2019-10-09 VITALS — Ht 62.0 in | Wt 168.0 lb

## 2019-10-09 DIAGNOSIS — L255 Unspecified contact dermatitis due to plants, except food: Secondary | ICD-10-CM

## 2019-10-09 MED ORDER — PREDNISONE 10 MG PO TABS: ORAL_TABLET | ORAL | 0 refills | Status: DC

## 2019-10-09 NOTE — Progress Notes (Signed)
Name: Alejandra Mcdaniel   MRN: [id number]    DOB: 08-10-1960   Date:10/09/2019       Progress Note  Subjective:    Chief Complaint  Chief Complaint  Patient presents with  . Rash    that has spread to face, saw Dr. Roxan Hockey earlier this week.  Forgot she has been pulling weeds so its most likely posion oak    I connected with  Waynard Reeds Heckel  on 10/09/19 at  1:20 PM EDT by a video enabled telemedicine application and verified that I am speaking with the correct person using two identifiers.  I discussed the limitations of evaluation and management by telemedicine and the availability of in person appointments. The patient expressed understanding and agreed to proceed. Staff also discussed with the patient that there may be a patient responsible charge related to this service. Patient Location: home Provider Location: [facility] Additional Individuals present: none  HPI Pt had visit here 2 days ago.  Started to groin.  Treated with topical antifungals and OTC hydrocortisone.  She has rash developing to her face and now recalls having worked out in the yard and suspects poison oak/poison ivy, which she has had before.  She has some linear rash to chin, some scattered red itchy bumps to cheeks.  Groin is still severely pruritic and erythematous, she has been unable to go to work.  She has tolerated 12 d tapers of prednisone before.     Patient Active Problem List   Diagnosis Date Noted  . Nicotine abuse 01/16/2017  . Aortic atherosclerosis ([place]) 01/07/2017  . Emphysema lung ([place]) 01/07/2017  . Hx of tobacco use, presenting hazards to health 11/19/2016  . Fatigue 10/08/2016  . Depression, major, recurrent ([place]) 02/13/2016  . Benign neoplasm of descending colon   . Benign neoplasm of sigmoid colon   . Electronic cigarette use 10/31/2014  . Hyperlipidemia   . Hypothyroidism   . Encounter for medication monitoring 10/11/2014    Social History   Tobacco Use  . Smoking status:  Former Smoker    Packs/day: 2.00    Years: 40.00    Pack years: 80.00    Types: Cigarettes    Quit date: 12/01/2012    Years since quitting: 6.8  . Smokeless tobacco: Never Used  Substance Use Topics  . Alcohol use: No     Current Outpatient Medications:  .  aspirin 81 MG tablet, Take 81 mg by mouth daily., Disp: , Rfl:  .  atorvastatin (LIPITOR) 80 MG tablet, Take 1 tablet (80 mg total) by mouth at bedtime., Disp: 90 tablet, Rfl: 1 .  cetirizine (ZYRTEC) 10 MG tablet, Take 1 tablet (10 mg total) by mouth daily as needed for allergies., Disp: 90 tablet, Rfl: 1 .  ketoconazole (NIZORAL) 2 % cream, Apply 1 application topically 2 (two) times daily for 14 days., Disp: 30 g, Rfl: 0 .  levothyroxine (EUTHYROX) 50 MCG tablet, Take 1 tab (50 mcg) PO daily except on Sun and Wed take 1.5 tabs (75 mcg) PO daily before breakfast, Disp: 90 tablet, Rfl: 1 .  predniSONE (DELTASONE) 10 MG tablet, Take 60 mg PO QAM x 2d, 50 mg poqam x 2d ... 10 mg poqam x 2d, Disp: 42 tablet, Rfl: 0  Allergies  Allergen Reactions  . Lexapro [Escitalopram Oxalate] Other (See Comments)    I personally reviewed active problem list, medication list, allergies, family history, social history, health maintenance, notes from last encounter, lab results, imaging with  the patient/caregiver today.   Review of Systems  10 Systems reviewed and are negative for acute change except as noted in the HPI.   Objective:   Virtual encounter, vitals limited, only able to obtain the following Today's Vitals   10/09/19 1148  Weight: 168 lb (76.2 kg)  Height: 5\' 2"  (1.575 m)   Body mass index is 30.73 kg/m. Nursing Note and Vital Signs reviewed.  Physical Exam Vitals and nursing note reviewed.  Constitutional:      General: She is not in acute distress.    Appearance: Normal appearance. She is well-developed. She is not ill-appearing, toxic-appearing or diaphoretic.  HENT:     Head: Normocephalic and atraumatic.  Eyes:      General:        Right eye: No discharge.        Left eye: No discharge.     Conjunctiva/sclera: Conjunctivae normal.     Comments: No periorbital edema, erythema or rash  Neck:     Trachea: No tracheal deviation.  Cardiovascular:     Rate and Rhythm: Normal rate.  Pulmonary:     Effort: Pulmonary effort is normal. No respiratory distress.     Breath sounds: No stridor.  Skin:    Coloration: Skin is not jaundiced or pale.     Findings: Rash present.     Comments: Rash to face - smaller linear distribution of erythematous papules to chin, some scattered maculopapular erythematous rash to cheeks - inspection limited by resolution of video encounter  Neurological:     Mental Status: She is alert.  Psychiatric:        Mood and Affect: Mood normal.        Behavior: Behavior normal.     PE limited by telephone encounter  No results found for this or any previous visit (from the past 72 hour(s)).  Assessment and Plan:     ICD-10-CM   1. Dermatitis due to plants, including poison ivy, sumac, and oak  L25.5 predniSONE (DELTASONE) 10 MG tablet   add steroid taper 12 d, discussed SE and benefit, f/up if rebound, prn benadryl - dosing reviewed and work note given     -Red flags and when to present for emergency care or RTC including chest pain, shortness of breath, new/worsening/un-resolving symptoms, reviewed with patient at time of visit. Follow up and care instructions discussed and provided in AVS. - I discussed the assessment and treatment plan with the patient. The patient was provided an opportunity to ask questions and all were answered. The patient agreed with the plan and demonstrated an understanding of the instructions.  I provided 20 minutes of non-face-to-face time during this encounter.  Delsa Grana, PA-C 10/09/19 1:38 PM

## 2019-10-09 NOTE — Patient Instructions (Signed)
Can take benadryl 25-50 mg every 4 to 6 hours as needed for rash and itching. Max 300 mg in 24 hours  Please follow up if not improving in the next 3-5 days Follow up if you have a recurrence of the rash after finishing steroids   Poison Ivy Dermatitis Poison ivy dermatitis is redness and soreness of the skin caused by chemicals in the leaves of the poison ivy plant. You may have very bad itching, swelling, a rash, and blisters. What are the causes?  Touching a poison ivy plant.  Touching something that has the chemical on it. This may include animals or objects that have come in contact with the plant. What increases the risk?  Going outdoors often in wooded or [place] areas.  Going outdoors without wearing protective clothing, such as closed shoes, long pants, and a long-sleeved shirt. What are the signs or symptoms?   Skin redness.  Very bad itching.  A rash that often includes bumps and blisters. ? The rash usually appears 48 hours after exposure, if you have been exposed before. ? If this is the first time you have been exposed, the rash may not appear until a week after exposure.  Swelling. This may occur if the reaction is very bad. Symptoms usually last for 1-2 weeks. The first time you develop this condition, symptoms may last 3-4 weeks. How is this treated? This condition may be treated with:  Hydrocortisone cream or calamine lotion to relieve itching.  Oatmeal baths to soothe the skin.  Medicines, such as over-the-counter antihistamine tablets.  Oral steroid medicine for more severe reactions. Follow these instructions at home: Medicines  Take or apply over-the-counter and prescription medicines only as told by your doctor.  Use hydrocortisone cream or calamine lotion as needed to help with itching. General instructions  Do not scratch or rub your skin.  Put a cold, wet cloth (cold compress) on the affected areas or take baths in cool water. This will  help with itching.  Avoid hot baths and showers.  Take oatmeal baths as needed. Use colloidal oatmeal. You can get this at a pharmacy or grocery store. Follow the instructions on the package.  While you have the rash, wash your clothes right after you wear them.  Keep all follow-up visits as told by your health care provider. This is important. How is this prevented?   Know what poison ivy looks like, so you can avoid it. ? This plant has three leaves with flowering branches on a single stem. ? The leaves are glossy. ? The leaves have uneven edges that come to a point at the front.  If you touch poison ivy, wash your skin with soap and water right away. Be sure to wash under your fingernails.  When hiking or camping, wear long pants, a long-sleeved shirt, tall socks, and hiking boots. You can also use a lotion on your skin that helps to prevent contact with poison ivy.  If you think that your clothes or outdoor gear came in contact with poison ivy, rinse them off with a garden hose before you bring them inside your house.  When doing yard work or gardening, wear gloves, long sleeves, long pants, and boots. Wash your garden tools and gloves if they come in contact with poison ivy.  If you think that your pet has come into contact with poison ivy, wash him or her with pet shampoo and water. Make sure to wear gloves while washing your pet. Contact a  doctor if:  You have open sores in the rash area.  You have more redness, swelling, or pain in the rash area.  You have redness that spreads beyond the rash area.  You have fluid, blood, or pus coming from the rash area.  You have a fever.  You have a rash over a large area of your body.  You have a rash on your eyes, mouth, or genitals.  Your rash does not get better after a few weeks. Get help right away if:  Your face swells or your eyes swell shut.  You have trouble breathing.  You have trouble swallowing. These symptoms  may be an emergency. Do not wait to see if the symptoms will go away. Get medical help right away. Call your local emergency services (911 in the [place] Do not drive yourself to the hospital. Summary  Poison ivy dermatitis is redness and soreness of the skin caused by chemicals in the leaves of the poison ivy plant.  You may have skin redness, very bad itching, swelling, and a rash.  Do not scratch or rub your skin.  Take or apply over-the-counter and prescription medicines only as told by your doctor. This information is not intended to replace advice given to you by your health care provider. Make sure you discuss any questions you have with your health care provider. Document Revised: 07/11/2018 Document Reviewed: 03/14/2018 Elsevier Patient Education  2020 [employer].

## 2019-10-09 NOTE — Telephone Encounter (Signed)
Called to report poison oak spreading from between legs to behind left ear and now on left chin and left side of nose. raised blisters noted. Moderate to severe itching reported. Denies fever. Patient remembers picking weeds and was seen in office on Wednesday. Rash now spreading to face. Care advise given. patient verbalized understanding of care advise and to call back and/or  seek medical attention at urgent care if symptoms worsen.  Reason for Disposition . [1] Severe poison ivy, oak, or sumac reaction in the past AND [2] face involved  Answer Assessment - Initial Assessment Questions 1. APPEARANCE of RASH: "Describe the rash."     Raised blisters . Left nose size of "dime" and left chin size of "50 cent piece".  2. LOCATION: "Where is the rash located?"      Started between legs now spread behind left ear and left chin and left nose  3. SIZE: "How large is the rash?"      Spreading to face approximately 1/2 inch to 1 inch  4. ONSET: "When did the rash begin?"      Seen in office on Wednesday and now spreading   5. ITCHING: "Does the rash itch?" If Yes, ask: "How bad is it?"   - MILD - doesn't interfere with normal activities   - MODERATE-SEVERE: interferes with work, school, sleep, or other activities      Moderate due to spreading  6. PREGNANCY: "Is there any chance you are pregnant?" "When was your last menstrual period?"     n/a  Protocols used: POISON IVY - OAK - SUMAC-A-AH

## 2019-10-09 NOTE — Telephone Encounter (Signed)
Patient called stating the prescription isnt working bc what she believes she has is actually poison oak bc the rash has spread to her hands and face, she wants to know if something else can be used or prescribed.please advise

## 2019-12-08 ENCOUNTER — Telehealth: Payer: Self-pay | Admitting: Family Medicine

## 2019-12-08 NOTE — Telephone Encounter (Signed)
ptis calling to ask nurse if Alejandra Mcdaniel could order her lab work, stated the last visit in Aug that she would need lab work before refilling  levothyroxine (EUTHYROX) 50 MCG tablet  atorvastatin (LIPITOR) 80 MG tablet  Not out quite yet but knew lab work needed to be done. Contact # [phone]

## 2019-12-09 ENCOUNTER — Other Ambulatory Visit: Payer: Self-pay | Admitting: Family Medicine

## 2019-12-09 DIAGNOSIS — E039 Hypothyroidism, unspecified: Secondary | ICD-10-CM

## 2019-12-09 NOTE — Telephone Encounter (Signed)
Pt notified, she will need a f/u appt scheduled

## 2019-12-09 NOTE — Telephone Encounter (Signed)
Yes she last had routine visits in sept2020 Did a visit for rash in July-  Need OV to manage dx/meds  Would do CMP, lipid, TSH and CBC if she wants to complete prior to appt you can order

## 2019-12-09 NOTE — Telephone Encounter (Signed)
Will she need f/u as well?

## 2019-12-18 ENCOUNTER — Other Ambulatory Visit: Payer: Self-pay | Admitting: Family Medicine

## 2019-12-29 DIAGNOSIS — M533 Sacrococcygeal disorders, not elsewhere classified: Secondary | ICD-10-CM | POA: Diagnosis not present

## 2020-01-01 ENCOUNTER — Other Ambulatory Visit: Payer: Self-pay | Admitting: Family Medicine

## 2020-01-01 DIAGNOSIS — E039 Hypothyroidism, unspecified: Secondary | ICD-10-CM

## 2020-01-07 NOTE — Progress Notes (Signed)
Name: PIERRETTE SCHEU   MRN: [id number]    DOB: 1960-11-22   Date:01/08/2020       Progress Note  Chief Complaint  Patient presents with  . Hyperlipidemia  . Hypothyroidism     Subjective:   Alejandra Mcdaniel is a 59 y.o. female, presents to clinic for routine follow-up she has not done her labs for over a year, last labs were abnormal  Hyperlipidemia: Currently treated with Lipitor 80 mg qd, pt reports good med compliance Last Lipids: Lab Results  Component Value Date   CHOL 162 12/29/2018   HDL 48 (L) 12/29/2018   LDLCALC 96 12/29/2018   TRIG 85 12/29/2018   CHOLHDL 3.4 12/29/2018   - Denies: Chest pain, myalgias, claudication Stopped smoking and its been 1 year since stopping vaping The 10-year ASCVD risk score Mikey Bussing DC Jr., et al., 2013) is: 2.7%   Values used to calculate the score:     Age: 49 years     Sex: Female     Is Non-Hispanic African American: No     Diabetic: No     Tobacco smoker: No     Systolic Blood Pressure: 619 mmHg     Is BP treated: No     HDL Cholesterol: 48 mg/dL     Total Cholesterol: 162 mg/dL   Hypothyroidism: Current Medication Regimen: 50 mcg synthroid 5d a week and 1 1/2 pills 75 mcg  Takes medicine first in the morning on empty stomach Current Symptoms: Gradual weight gain over the past 1 to 2 years She denies associated symptoms:  feeling cold and cold intolerance, constipation, swelling, feeling slow, losing hair, anxiousness, feeling excessive energy, tremulousness, palpitations, sweating and weight loss  Most recent results -labs from 2019-2020 were all abnormal and elevated, medications were not adjusted and patient has not returned for repeated labs Lab Results  Component Value Date   TSH 4.52 (H) 12/29/2018   TSH 4.51 (H) 03/24/2018   TSH 5.17 (H) 10/31/2017    SOB - all the time, she has an inhaler- uses 2-3 times a week and sometimes doesn't use at all Walking she gets SOB - with walking short distance esp with an  incline she is SOB and has to rest She reports that she can walk up 1-2 flights of stairs without significant shortness of breath, will feel a little bit winded but can recover very quickly.  She states that walking from work to her car or if on an incline for less than 50 yards she will have more significant dyspnea on exertion.  She denies any chest pain, hemoptysis, unintentional weight loss, chronic cough. She reports a diagnosis of emphysema due to doing a cancer screening test a while ago.     Reviewed the chart significant smoking hx In 2018 did LDCT screening - lost to f/up  IMPRESSION: 1. Lung-RADS 1, negative. Continue annual screening with low-dose chest CT without contrast in 12 months. 2.  Emphysema (ICD10-J43.9). 3.  Aortic Atherosclerosis (ICD10-I70.0).  She has not consulted with the pulmonologist before nor has she been on inhalers or had bronchitis frequently  Obesity, BMI 31, gradual weight gain over the past 2 years, she does state that she is hungry all the time and does not monitor what she eats at all she states that she could "lose some weight if she stopped eating every time she bends her elbow" Wt Readings from Last 5 Encounters:  01/08/20 170 lb 14.4 oz (77.5 kg)  10/09/19 168  lb (76.2 kg)  10/07/19 168 lb 14.4 oz (76.6 kg)  12/29/18 161 lb 12.8 oz (73.4 kg)  10/31/17 156 lb 3.2 oz (70.9 kg)   BMI Readings from Last 5 Encounters:  01/08/20 31.26 kg/m  10/09/19 30.73 kg/m  10/07/19 30.89 kg/m  12/29/18 28.66 kg/m  10/31/17 27.67 kg/m   And further discussing her exertional shortness of breath she denies any palpitations, orthopnea, PND, chest pain or pressure with exertion or physical activity, syncopal episodes, lower extremity edema, or associated diaphoresis   Current Outpatient Medications:  .  aspirin 81 MG tablet, Take 81 mg by mouth daily., Disp: , Rfl:  .  atorvastatin (LIPITOR) 80 MG tablet, TAKE 1 TABLET AT BEDTIME, Disp: 90 tablet, Rfl:  1 .  cetirizine (ZYRTEC) 10 MG tablet, Take 1 tablet (10 mg total) by mouth daily as needed for allergies., Disp: 90 tablet, Rfl: 1 .  EUTHYROX 50 MCG tablet, TAKE 1 TABLET DAILY EXCEPT ON SUNDAYS AND WEDNESDAYS  TAKE 1 AND 1/2 TABLETS     DAILY BEFORE BREAKFAST, Disp: 35 tablet, Rfl: 0 .  predniSONE (DELTASONE) 10 MG tablet, Take 60 mg PO QAM x 2d, 50 mg poqam x 2d ... 10 mg poqam x 2d (Patient not taking: Reported on 01/08/2020), Disp: 42 tablet, Rfl: 0  Patient Active Problem List   Diagnosis Date Noted  . Nicotine abuse 01/16/2017  . Aortic atherosclerosis ([place]) 01/07/2017  . Emphysema lung ([place]) 01/07/2017  . Hx of tobacco use, presenting hazards to health 11/19/2016  . Fatigue 10/08/2016  . Depression, major, recurrent ([place]) 02/13/2016  . Benign neoplasm of descending colon   . Benign neoplasm of sigmoid colon   . Electronic cigarette use 10/31/2014  . Hyperlipidemia   . Hypothyroidism   . Encounter for medication monitoring 10/11/2014    Past Surgical History:  Procedure Laterality Date  . CESAREAN SECTION  x2  . COLONOSCOPY WITH PROPOFOL N/A 12/03/2014   Procedure: COLONOSCOPY WITH PROPOFOL;  Surgeon: Lucilla Lame, MD;  Location: [facility];  Service: Endoscopy;  Laterality: N/A;  . POLYPECTOMY  12/03/2014   Procedure: POLYPECTOMY;  Surgeon: Lucilla Lame, MD;  Location: [facility];  Service: Endoscopy;;  . TONSILLECTOMY      Family History  Problem Relation Age of Onset  . Heart disease Father   . Diabetes Father   . Hyperlipidemia Father   . Thyroid disease Father   . COPD Sister   . Heart disease Sister   . Diabetes Brother   . Hypertension Brother   . Hepatitis C Brother   . Kidney disease Brother   . Diabetes Sister   . Cancer Mother        cervical  . Heart disease Mother   . Diabetes Mother   . Hyperlipidemia Mother   . Heart disease Maternal Grandfather   . Cancer Brother        kidney   . Breast cancer Paternal Aunt 51    Social  History   Tobacco Use  . Smoking status: Former Smoker    Packs/day: 2.00    Years: 40.00    Pack years: 80.00    Types: Cigarettes    Quit date: 12/01/2012    Years since quitting: 7.1  . Smokeless tobacco: Never Used  Vaping Use  . Vaping Use: Every day  . Substances: Nicotine  Substance Use Topics  . Alcohol use: No  . Drug use: No     Allergies  Allergen Reactions  . Lexapro [  Escitalopram Oxalate] Other (See Comments)    Health Maintenance  Topic Date Due  . PAP SMEAR-Modifier  10/28/2017  . MAMMOGRAM  11/21/2017  . COVID-19 Vaccine (1) 01/24/2020 (Originally 01/08/1973)  . INFLUENZA VACCINE  06/30/2020 (Originally 11/01/2019)  . COLONOSCOPY  12/02/2024  . TETANUS/TDAP  11/20/2026  . Hepatitis C Screening  Completed  . HIV Screening  Completed    Chart Review Today: I personally reviewed active problem list, medication list, allergies, family history, social history, health maintenance, notes from last encounter, lab results, imaging with the patient/caregiver today.   Review of Systems  10 Systems reviewed and are negative for acute change except as noted in the HPI.  Objective:   Vitals:   01/08/20 1124  BP: 120/72  Pulse: 69  Resp: 16  Temp: 97.9 F (36.6 C)  TempSrc: Oral  SpO2: 99%  Weight: 170 lb 14.4 oz (77.5 kg)  Height: 5\' 2"  (1.575 m)    Body mass index is 31.26 kg/m.  Physical Exam Vitals and nursing note reviewed.  Constitutional:      General: She is not in acute distress.    Appearance: Normal appearance. She is well-developed. She is obese. She is not ill-appearing, toxic-appearing or diaphoretic.     Interventions: Face mask in place.  HENT:     Head: Normocephalic and atraumatic.     Right Ear: External ear normal.     Left Ear: External ear normal.  Eyes:     General: Lids are normal. No scleral icterus.       Right eye: No discharge.        Left eye: No discharge.     Conjunctiva/sclera: Conjunctivae normal.  Neck:      Trachea: Phonation normal. No tracheal deviation.  Cardiovascular:     Rate and Rhythm: Normal rate and regular rhythm.     Pulses: Normal pulses.          Radial pulses are 2+ on the right side and 2+ on the left side.       Posterior tibial pulses are 2+ on the right side and 2+ on the left side.     Heart sounds: Normal heart sounds. No murmur heard.  No friction rub. No gallop.   Pulmonary:     Effort: Pulmonary effort is normal. No respiratory distress.     Breath sounds: Normal breath sounds. No stridor. No wheezing, rhonchi or rales.  Chest:     Chest wall: No tenderness.  Abdominal:     General: Bowel sounds are normal. There is no distension.     Palpations: Abdomen is soft.  Musculoskeletal:     Right lower leg: No edema.     Left lower leg: No edema.  Skin:    General: Skin is warm and dry.     Coloration: Skin is not jaundiced or pale.     Findings: No rash.  Neurological:     Mental Status: She is alert.     Motor: No abnormal muscle tone.     Gait: Gait normal.  Psychiatric:        Mood and Affect: Mood normal.        Speech: Speech normal.        Behavior: Behavior normal.         Assessment & Plan:   1. Mixed hyperlipidemia Compliant with meds, no SE, no myalgias, fatigue or jaundice Due for FLP and recheck CMP Diet and exercise recommendations for HLD reviewed  -  COMPLETE METABOLIC PANEL WITH GFR - Lipid panel  2. Hypothyroidism, unspecified type TSH abnormal and elevated for more than 2 years - has not f/up in office for more than one year  She reports weight gain but no other symptoms concerning for chemical hypothyroid, if TSH is abnormally elevated or in the high end of normal we may still increase her dose, will wait to see what the labs show - CBC with Differential/Platelet - TSH  3. Aortic atherosclerosis (HCC) On statin, monitoring - COMPLETE METABOLIC PANEL WITH GFR - Lipid panel  4. Centrilobular emphysema ([place]) See #6 below  -emphysema was incidentally found a lung cancer screening CT she has not previously reported symptoms or treated with inhalers and she has not seen pulmonology before.  She today reports dyspnea on exertion, she stopped smoking a few years ago and was able to stop vaping 1 year ago today, no chronic cough, no recent episodes of acute bronchitis or exacerbation I have sent a message to the lung cancer screening team to see if she can get restarted on annual screenings.  She agreed to a pulmonology referral for further evaluation - Ambulatory referral to Pulmonology  5. Encounter for medication monitoring - CBC with Differential/Platelet - COMPLETE METABOLIC PANEL WITH GFR - Lipid panel - TSH  6. DOE (dyspnea on exertion) Patient reports exertional dyspnea with walking about 50 yards if there is an incline.  She does not have orthopnea, palpitations, lower extremity edema, chest pressure pain, PND -less likely to be cardiac in nature.  We will start with trial of Trelegy daily inhaler explained how to use, once daily at same time a day, to swish and spit afterwards to avoid side effects in her mouth, I explained PFT testing etc. Encouraged her to contact her insurance pharmaceutical coverage and see what combination maintenance inhalers are on formulary and that she could afford - Ambulatory referral to Pulmonology If the pulmonology work-up is unremarkable patient may need a cardiac consultation or echo?    7. Encounter for screening mammogram for malignant neoplasm of breast - MM 3D SCREEN BREAST BILATERAL; Future   Return for next 3-6 months CPE.   Delsa Grana, PA-C 01/08/20 11:55 AM

## 2020-01-08 ENCOUNTER — Encounter: Payer: Self-pay | Admitting: Family Medicine

## 2020-01-08 ENCOUNTER — Other Ambulatory Visit: Payer: Self-pay

## 2020-01-08 ENCOUNTER — Ambulatory Visit: Payer: BC Managed Care – PPO | Admitting: Family Medicine

## 2020-01-08 VITALS — BP 120/72 | HR 69 | Temp 97.9°F | Resp 16 | Ht 62.0 in | Wt 170.9 lb

## 2020-01-08 DIAGNOSIS — E039 Hypothyroidism, unspecified: Secondary | ICD-10-CM

## 2020-01-08 DIAGNOSIS — Z5181 Encounter for therapeutic drug level monitoring: Secondary | ICD-10-CM | POA: Diagnosis not present

## 2020-01-08 DIAGNOSIS — R0609 Other forms of dyspnea: Secondary | ICD-10-CM

## 2020-01-08 DIAGNOSIS — E782 Mixed hyperlipidemia: Secondary | ICD-10-CM

## 2020-01-08 DIAGNOSIS — Z1231 Encounter for screening mammogram for malignant neoplasm of breast: Secondary | ICD-10-CM

## 2020-01-08 DIAGNOSIS — I700 Atherosclerosis of aorta: Secondary | ICD-10-CM

## 2020-01-08 DIAGNOSIS — R0600 Dyspnea, unspecified: Secondary | ICD-10-CM

## 2020-01-08 DIAGNOSIS — J432 Centrilobular emphysema: Secondary | ICD-10-CM | POA: Diagnosis not present

## 2020-01-08 LAB — LIPID PANEL
Cholesterol: 180 mg/dL (ref <200)
HDL: 57 mg/dL (ref >=50)
LDL Cholesterol (Calc): 104 mg/dL (calc) — ABNORMAL HIGH
Non-HDL Cholesterol (Calc): 123 mg/dL (calc) (ref <130)
Total CHOL/HDL Ratio: 3.2 (calc) (ref <5.0)
Triglycerides: 97 mg/dL (ref <150)

## 2020-01-08 LAB — COMPLETE METABOLIC PANEL WITH GFR
AG Ratio: 1.7 (calc) (ref 1.0–2.5)
ALT: 22 U/L (ref 6–29)
AST: 20 U/L (ref 10–35)
Albumin: 4.3 g/dL (ref 3.6–5.1)
Alkaline phosphatase (APISO): 80 U/L (ref 37–153)
BUN: 19 mg/dL (ref 7–25)
CO2: 28 mmol/L (ref 20–32)
Calcium: 9.9 mg/dL (ref 8.6–10.4)
Chloride: 102 mmol/L (ref 98–110)
Creat: 0.64 mg/dL (ref 0.50–0.99)
GFR, Est African American: 112 mL/min/{1.73_m2} (ref >=60)
GFR, Est Non African American: 97 mL/min/{1.73_m2} (ref >=60)
Globulin: 2.6 g/dL (calc) (ref 1.9–3.7)
Glucose, Bld: 87 mg/dL (ref 65–99)
Potassium: 4.9 mmol/L (ref 3.5–5.3)
Sodium: 138 mmol/L (ref 135–146)
Total Bilirubin: 1.4 mg/dL — ABNORMAL HIGH (ref 0.2–1.2)
Total Protein: 6.9 g/dL (ref 6.1–8.1)

## 2020-01-08 LAB — CBC WITH DIFFERENTIAL/PLATELET
Absolute Monocytes: 531 cells/uL (ref 200–950)
Basophils Absolute: 18 cells/uL (ref 0–200)
Basophils Relative: 0.3 %
Eosinophils Absolute: 242 cells/uL (ref 15–500)
Eosinophils Relative: 4.1 %
HCT: 41.5 % (ref 35.0–45.0)
Hemoglobin: 14.1 g/dL (ref 11.7–15.5)
Lymphs Abs: 1687 cells/uL (ref 850–3900)
MCH: 29.6 pg (ref 27.0–33.0)
MCHC: 34 g/dL (ref 32.0–36.0)
MCV: 87 fL (ref 80.0–100.0)
MPV: 11.3 fL (ref 7.5–12.5)
Monocytes Relative: 9 %
Neutro Abs: 3422 cells/uL (ref 1500–7800)
Neutrophils Relative %: 58 %
Platelets: 238 10*3/uL (ref 140–400)
RBC: 4.77 10*6/uL (ref 3.80–5.10)
RDW: 12.3 % (ref 11.0–15.0)
Total Lymphocyte: 28.6 %
WBC: 5.9 10*3/uL (ref 3.8–10.8)

## 2020-01-08 LAB — TSH: TSH: 14.31 mIU/L — ABNORMAL HIGH (ref 0.40–4.50)

## 2020-01-08 NOTE — Patient Instructions (Signed)
Advair, symbicort, breo, trelegy - these are daily inhalers that help manage COPD/emphysema Call your insurance and see what inhalers are preferred and covered and let me know so I can send one in  Follow up with pulmonology

## 2020-01-11 ENCOUNTER — Other Ambulatory Visit: Payer: Self-pay | Admitting: Family Medicine

## 2020-01-11 ENCOUNTER — Telehealth: Payer: Self-pay | Admitting: *Deleted

## 2020-01-11 DIAGNOSIS — E039 Hypothyroidism, unspecified: Secondary | ICD-10-CM

## 2020-01-11 MED ORDER — LEVOTHYROXINE SODIUM 50 MCG PO TABS: ORAL_TABLET | ORAL | 3 refills | Status: DC

## 2020-01-11 NOTE — Telephone Encounter (Signed)
Contacted in attempt to schedule lung screening scan. Patient will call me back when she knows her schedule.

## 2020-01-12 NOTE — Progress Notes (Signed)
Lvm for pt to call and schedule this appt

## 2020-01-13 ENCOUNTER — Other Ambulatory Visit: Payer: Self-pay | Admitting: Family Medicine

## 2020-01-13 DIAGNOSIS — E039 Hypothyroidism, unspecified: Secondary | ICD-10-CM

## 2020-01-13 NOTE — Telephone Encounter (Signed)
Medication Refill - Medication: levothyroxine (SYNTHROID) 50 MCG tablet (Patient stated that PCP went up on her dosage and she does not have enough medication to last her due to this change. Patient would like medication sent to local pharmacy today and a callback once its been sent to pharmacy.)   Has the patient contacted their pharmacy? yes (Agent: If no, request that the patient contact the pharmacy for the refill.) (Agent: If yes, when and what did the pharmacy advise?)Contact PCP  Preferred Pharmacy (with phone number or street name):  [id number] - [place], [address] Phone:  [phone]  Fax:  [phone]       Agent: Please be advised that RX refills may take up to 3 business days. We ask that you follow-up with your pharmacy.

## 2020-01-13 NOTE — Telephone Encounter (Signed)
Phone call to pt. To discuss Synthroid.  Advised that a new Rx was sent to [facility] on [address], [place], on 01/11/20.  Also advised pt. that Delsa Grana recommended pt. to come back in 6 weeks to re-evaluate her Thyroid.  Appt. Scheduled for 02/19/20.  Pt. Verb. Understanding of the above.  Stated she is okay with the new Rx being sent to [facility] and will pick it up, to start the increased dose.

## 2020-02-02 ENCOUNTER — Encounter: Payer: Self-pay | Admitting: *Deleted

## 2020-02-02 ENCOUNTER — Telehealth: Payer: Self-pay | Admitting: *Deleted

## 2020-02-02 NOTE — Telephone Encounter (Signed)
Attempted to contact and schedule lung screening scan. Message left for patient to call back to schedule. 

## 2020-02-15 ENCOUNTER — Other Ambulatory Visit: Payer: Self-pay | Admitting: Family Medicine

## 2020-02-15 DIAGNOSIS — J302 Other seasonal allergic rhinitis: Secondary | ICD-10-CM

## 2020-02-19 ENCOUNTER — Ambulatory Visit: Payer: Self-pay | Admitting: Family Medicine

## 2020-02-22 DIAGNOSIS — Z1231 Encounter for screening mammogram for malignant neoplasm of breast: Secondary | ICD-10-CM | POA: Diagnosis not present

## 2020-02-22 DIAGNOSIS — Z87891 Personal history of nicotine dependence: Secondary | ICD-10-CM | POA: Diagnosis not present

## 2020-02-22 DIAGNOSIS — E039 Hypothyroidism, unspecified: Secondary | ICD-10-CM | POA: Diagnosis not present

## 2020-02-22 DIAGNOSIS — E559 Vitamin D deficiency, unspecified: Secondary | ICD-10-CM | POA: Diagnosis not present

## 2020-02-22 DIAGNOSIS — E785 Hyperlipidemia, unspecified: Secondary | ICD-10-CM | POA: Diagnosis not present

## 2020-02-23 ENCOUNTER — Telehealth: Payer: Self-pay | Admitting: *Deleted

## 2020-02-23 DIAGNOSIS — Z122 Encounter for screening for malignant neoplasm of respiratory organs: Secondary | ICD-10-CM

## 2020-02-23 DIAGNOSIS — Z87891 Personal history of nicotine dependence: Secondary | ICD-10-CM

## 2020-02-23 NOTE — Telephone Encounter (Signed)
Contacted and pending scheduling lung screening scan. Patient is a former smoker, quit 12/01/12, 80 pack year history. Reports new insurance of [employer] [id number]

## 2020-02-23 NOTE — Addendum Note (Signed)
Addended by: Lieutenant Diego on: 02/23/2020 11:07 AM   Modules accepted: Orders

## 2020-02-23 NOTE — Telephone Encounter (Signed)
Attempted to contact and schedule lung screening scan. Message left for patient to call back to schedule. 

## 2020-03-08 ENCOUNTER — Ambulatory Visit: Payer: BLUE CROSS/BLUE SHIELD

## 2020-04-04 DIAGNOSIS — U071 COVID-19: Secondary | ICD-10-CM | POA: Diagnosis not present

## 2020-04-04 DIAGNOSIS — Z03818 Encounter for observation for suspected exposure to other biological agents ruled out: Secondary | ICD-10-CM | POA: Diagnosis not present

## 2020-04-04 DIAGNOSIS — Z20822 Contact with and (suspected) exposure to covid-19: Secondary | ICD-10-CM | POA: Diagnosis not present

## 2020-04-08 DIAGNOSIS — K668 Other specified disorders of peritoneum: Secondary | ICD-10-CM | POA: Diagnosis not present

## 2020-04-08 DIAGNOSIS — E039 Hypothyroidism, unspecified: Secondary | ICD-10-CM | POA: Diagnosis not present

## 2020-04-08 DIAGNOSIS — R911 Solitary pulmonary nodule: Secondary | ICD-10-CM | POA: Diagnosis not present

## 2020-04-08 DIAGNOSIS — R918 Other nonspecific abnormal finding of lung field: Secondary | ICD-10-CM | POA: Diagnosis not present

## 2020-04-08 DIAGNOSIS — Z87891 Personal history of nicotine dependence: Secondary | ICD-10-CM | POA: Diagnosis not present

## 2020-04-08 DIAGNOSIS — Z1231 Encounter for screening mammogram for malignant neoplasm of breast: Secondary | ICD-10-CM | POA: Diagnosis not present

## 2020-04-12 ENCOUNTER — Other Ambulatory Visit: Payer: Self-pay | Admitting: Specialist

## 2020-04-12 ENCOUNTER — Encounter: Payer: BC Managed Care – PPO | Admitting: Family Medicine

## 2020-04-13 ENCOUNTER — Other Ambulatory Visit: Payer: Self-pay | Admitting: Specialist

## 2020-04-13 ENCOUNTER — Other Ambulatory Visit (HOSPITAL_COMMUNITY): Payer: Self-pay | Admitting: Specialist

## 2020-04-13 DIAGNOSIS — R918 Other nonspecific abnormal finding of lung field: Secondary | ICD-10-CM

## 2020-05-11 ENCOUNTER — Other Ambulatory Visit: Payer: Self-pay | Admitting: Family Medicine

## 2020-05-11 DIAGNOSIS — J302 Other seasonal allergic rhinitis: Secondary | ICD-10-CM

## 2020-05-12 ENCOUNTER — Telehealth: Payer: Self-pay

## 2020-05-12 NOTE — Telephone Encounter (Signed)
Attempted to contact patient for scheduling lung screening scan. Left message for patient to call [phone] to schedule CT scan.

## 2020-05-16 ENCOUNTER — Ambulatory Visit: Payer: BC Managed Care – PPO

## 2020-05-18 ENCOUNTER — Telehealth: Payer: Self-pay | Admitting: *Deleted

## 2020-05-18 NOTE — Telephone Encounter (Signed)
Attempted to contact patient for scheduling lung screening scan. Left message and sent my chart message for patient to call [phone] to schedule ct scan.

## 2020-06-03 ENCOUNTER — Ambulatory Visit
Admission: RE | Admit: 2020-06-03 | Discharge: 2020-06-03 | Disposition: A | Discharge status: Home / Self Care | Payer: BC Managed Care – PPO | Source: Ambulatory Visit | Attending: Specialist | Admitting: Specialist

## 2020-06-03 ENCOUNTER — Other Ambulatory Visit: Payer: Self-pay

## 2020-06-03 DIAGNOSIS — I700 Atherosclerosis of aorta: Secondary | ICD-10-CM | POA: Diagnosis not present

## 2020-06-03 DIAGNOSIS — R918 Other nonspecific abnormal finding of lung field: Secondary | ICD-10-CM | POA: Diagnosis not present

## 2020-06-03 DIAGNOSIS — N200 Calculus of kidney: Secondary | ICD-10-CM | POA: Diagnosis not present

## 2020-06-03 DIAGNOSIS — Z8719 Personal history of other diseases of the digestive system: Secondary | ICD-10-CM | POA: Diagnosis not present

## 2020-06-03 LAB — POCT I-STAT CREATININE: Creatinine, Ser: 0.6 mg/dL (ref 0.44–1.00)

## 2020-06-03 MED ORDER — IOHEXOL 300 MG/ML  SOLN
100.0000 mL | Freq: Once | INTRAMUSCULAR | Status: AC | PRN
  Administered 2020-06-03: 100 mL via INTRAVENOUS

## 2020-07-19 DIAGNOSIS — L719 Rosacea, unspecified: Secondary | ICD-10-CM | POA: Diagnosis not present

## 2020-07-19 DIAGNOSIS — E785 Hyperlipidemia, unspecified: Secondary | ICD-10-CM | POA: Diagnosis not present

## 2020-07-19 DIAGNOSIS — Z0000 Encounter for general adult medical examination without abnormal findings: Secondary | ICD-10-CM | POA: Diagnosis not present

## 2020-07-19 DIAGNOSIS — M8589 Other specified disorders of bone density and structure, multiple sites: Secondary | ICD-10-CM | POA: Diagnosis not present

## 2020-07-19 DIAGNOSIS — Z6831 Body mass index (BMI) 31.0-31.9, adult: Secondary | ICD-10-CM | POA: Diagnosis not present

## 2020-07-19 DIAGNOSIS — M722 Plantar fascial fibromatosis: Secondary | ICD-10-CM | POA: Diagnosis not present

## 2020-07-19 DIAGNOSIS — R911 Solitary pulmonary nodule: Secondary | ICD-10-CM | POA: Diagnosis not present

## 2020-07-19 DIAGNOSIS — Z122 Encounter for screening for malignant neoplasm of respiratory organs: Secondary | ICD-10-CM | POA: Diagnosis not present

## 2020-07-25 DIAGNOSIS — M722 Plantar fascial fibromatosis: Secondary | ICD-10-CM | POA: Diagnosis not present

## 2020-08-17 ENCOUNTER — Other Ambulatory Visit: Payer: Self-pay | Admitting: Family Medicine

## 2020-08-17 DIAGNOSIS — J302 Other seasonal allergic rhinitis: Secondary | ICD-10-CM

## 2020-10-15 DIAGNOSIS — L600 Ingrowing nail: Secondary | ICD-10-CM | POA: Diagnosis not present

## 2020-10-15 DIAGNOSIS — L03032 Cellulitis of left toe: Secondary | ICD-10-CM | POA: Diagnosis not present

## 2020-11-24 DIAGNOSIS — R739 Hyperglycemia, unspecified: Secondary | ICD-10-CM | POA: Diagnosis not present

## 2020-11-24 DIAGNOSIS — R5383 Other fatigue: Secondary | ICD-10-CM | POA: Diagnosis not present

## 2020-11-24 DIAGNOSIS — E039 Hypothyroidism, unspecified: Secondary | ICD-10-CM | POA: Diagnosis not present

## 2020-11-24 DIAGNOSIS — R42 Dizziness and giddiness: Secondary | ICD-10-CM | POA: Diagnosis not present

## 2021-03-21 DIAGNOSIS — E785 Hyperlipidemia, unspecified: Secondary | ICD-10-CM | POA: Diagnosis not present

## 2021-03-21 DIAGNOSIS — J029 Acute pharyngitis, unspecified: Secondary | ICD-10-CM | POA: Diagnosis not present

## 2021-03-21 DIAGNOSIS — E079 Disorder of thyroid, unspecified: Secondary | ICD-10-CM | POA: Diagnosis not present

## 2021-03-21 DIAGNOSIS — Z79899 Other long term (current) drug therapy: Secondary | ICD-10-CM | POA: Diagnosis not present

## 2021-03-21 DIAGNOSIS — Z20822 Contact with and (suspected) exposure to covid-19: Secondary | ICD-10-CM | POA: Diagnosis not present

## 2021-03-21 DIAGNOSIS — J40 Bronchitis, not specified as acute or chronic: Secondary | ICD-10-CM | POA: Diagnosis not present

## 2021-03-21 DIAGNOSIS — K219 Gastro-esophageal reflux disease without esophagitis: Secondary | ICD-10-CM | POA: Diagnosis not present

## 2021-03-21 DIAGNOSIS — J028 Acute pharyngitis due to other specified organisms: Secondary | ICD-10-CM | POA: Diagnosis not present

## 2021-03-21 DIAGNOSIS — R059 Cough, unspecified: Secondary | ICD-10-CM | POA: Diagnosis not present

## 2021-03-21 DIAGNOSIS — Z87891 Personal history of nicotine dependence: Secondary | ICD-10-CM | POA: Diagnosis not present

## 2021-03-21 DIAGNOSIS — Z7982 Long term (current) use of aspirin: Secondary | ICD-10-CM | POA: Diagnosis not present

## 2021-10-14 DIAGNOSIS — M722 Plantar fascial fibromatosis: Secondary | ICD-10-CM | POA: Diagnosis not present

## 2022-01-06 ENCOUNTER — Emergency Department
Admission: EM | Admit: 2022-01-06 | Discharge: 2022-01-06 | Disposition: A | Discharge status: Home / Self Care | Payer: BC Managed Care – PPO | Attending: Emergency Medicine | Admitting: Emergency Medicine

## 2022-01-06 ENCOUNTER — Other Ambulatory Visit: Payer: Self-pay

## 2022-01-06 ENCOUNTER — Encounter: Payer: Self-pay | Admitting: Emergency Medicine

## 2022-01-06 DIAGNOSIS — B349 Viral infection, unspecified: Secondary | ICD-10-CM | POA: Insufficient documentation

## 2022-01-06 DIAGNOSIS — Z87891 Personal history of nicotine dependence: Secondary | ICD-10-CM | POA: Insufficient documentation

## 2022-01-06 DIAGNOSIS — R509 Fever, unspecified: Secondary | ICD-10-CM | POA: Diagnosis not present

## 2022-01-06 MED ORDER — GUAIFENESIN-CODEINE 100-10 MG/5ML PO SYRP: 5.0000 mL | ORAL_SOLUTION | Freq: Three times a day (TID) | ORAL | 0 refills | Status: DC | PRN

## 2022-01-06 MED ORDER — PREDNISONE 10 MG (21) PO TBPK: ORAL_TABLET | ORAL | 0 refills | Status: DC

## 2022-01-06 MED ORDER — ALBUTEROL SULFATE HFA 108 (90 BASE) MCG/ACT IN AERS: 2.0000 | INHALATION_SPRAY | RESPIRATORY_TRACT | 1 refills | Status: DC | PRN

## 2022-01-06 NOTE — ED Provider Notes (Signed)
[facility] Provider Note    Event Date/Time   First MD Initiated Contact with Patient 01/06/22 1002     (approximate)   History   Cough and Fever   HPI  Alejandra Mcdaniel is a 61 y.o. female with history of hyperlipidemia, bronchitis and as listed in EMR presents to the emergency department for treatment and evaluation of  cough for nearly a week and fever. She has used her inhaler and OTC cold medications without relief. No known Covid exposure.        Physical Exam   Triage Vital Signs: ED Triage Vitals  Enc Vitals Group     BP 01/06/22 0923 (!) 119/91     Pulse Rate 01/06/22 0923 82     Resp 01/06/22 0923 16     Temp 01/06/22 0923 98.1 F (36.7 C)     Temp Source 01/06/22 0923 Oral     SpO2 01/06/22 0923 98 %     Weight 01/06/22 0916 169 lb 12.1 oz (77 kg)     Height 01/06/22 0916 '5\' 2"'$  (1.575 m)     Head Circumference --      Peak Flow --      Pain Score 01/06/22 0916 0     Pain Loc --      Pain Edu? --      Excl. in [place]? --     Most recent vital signs: Vitals:   01/06/22 0923  BP: (!) 119/91  Pulse: 82  Resp: 16  Temp: 98.1 F (36.7 C)  SpO2: 98%     General: Awake, no distress.  CV:  Good peripheral perfusion.  Resp:  Normal effort. Breath sounds clear to auscultation. Abd:  No distention.  Other:     ED Results / Procedures / Treatments   Labs (all labs ordered are listed, but only abnormal results are displayed) Labs Reviewed - No data to display   EKG  Not indicated.   RADIOLOGY  Not indicated.   PROCEDURES:  Critical Care performed: No  Procedures   MEDICATIONS ORDERED IN ED: Medications - No data to display   IMPRESSION / MDM / [place] / ED COURSE  I reviewed the triage vital signs and the nursing notes.                              Differential diagnosis includes, but is not limited to, acute viral syndrome, sinusitis, Covid, influenza  Patient's presentation is most  consistent with acute, uncomplicated illness.  61 year old female presenting to the emergency department for treatment and evaluation of viral symptoms as described in the HPI.  On exam, she does appear to have a URI.  No fever.  Symptoms get worse when she lays down at night and she does notice some wheezing.  Her chart has a history of emphysema listed, however the patient states that she has never been told she had emphysema.  She stopped smoking cigarettes approximately 9 years ago and then 3 years ago she stopped vaping.  COVID and influenza testing offered however the patient declines and states that she does not feel like it is either of the 2 of those.  Plan will be to treat her with a burst dose of steroid, cough medicine and give her a refill of her albuterol.  She is agreeable to this plan.  She is to schedule an appointment with primary care if not improving  over the next couple days.  She is to return to the emergency department for symptoms that change or worsen if she is unable to schedule an appointment.     FINAL CLINICAL IMPRESSION(S) / ED DIAGNOSES   Final diagnoses:  Viral syndrome     Rx / DC Orders   ED Discharge Orders          Ordered    albuterol (VENTOLIN HFA) 108 (90 Base) MCG/ACT inhaler  Every 4 hours PRN        01/06/22 1204    predniSONE (STERAPRED UNI-PAK 21 TAB) 10 MG (21) TBPK tablet        01/06/22 1204    guaiFENesin-codeine (ROBITUSSIN AC) 100-10 MG/5ML syrup  3 times daily PRN        01/06/22 1204             Note:  This document was prepared using Dragon voice recognition software and may include unintentional dictation errors.   Victorino Dike, FNP 01/06/22 1351    Vanessa [place], MD 01/07/22 [phone]

## 2022-01-06 NOTE — ED Triage Notes (Signed)
Pt reports started with cough Monday and ran a fever yesterday. Pt reports feels like bronchitis which she has had in the past. Pt reports has been using her inhaler and taking OTC meds with little relief. Denies SOB or productive cough.

## 2022-01-15 DIAGNOSIS — E559 Vitamin D deficiency, unspecified: Secondary | ICD-10-CM | POA: Diagnosis not present

## 2022-01-15 DIAGNOSIS — R911 Solitary pulmonary nodule: Secondary | ICD-10-CM | POA: Diagnosis not present

## 2022-01-15 DIAGNOSIS — E785 Hyperlipidemia, unspecified: Secondary | ICD-10-CM | POA: Diagnosis not present

## 2022-01-15 DIAGNOSIS — E039 Hypothyroidism, unspecified: Secondary | ICD-10-CM | POA: Diagnosis not present

## 2022-01-15 DIAGNOSIS — J4520 Mild intermittent asthma, uncomplicated: Secondary | ICD-10-CM | POA: Diagnosis not present

## 2022-01-15 DIAGNOSIS — Z87891 Personal history of nicotine dependence: Secondary | ICD-10-CM | POA: Diagnosis not present

## 2022-01-15 DIAGNOSIS — Z1231 Encounter for screening mammogram for malignant neoplasm of breast: Secondary | ICD-10-CM | POA: Diagnosis not present

## 2022-02-14 DIAGNOSIS — E039 Hypothyroidism, unspecified: Secondary | ICD-10-CM | POA: Diagnosis not present

## 2022-03-09 DIAGNOSIS — R92333 Mammographic heterogeneous density, bilateral breasts: Secondary | ICD-10-CM | POA: Diagnosis not present

## 2022-03-09 DIAGNOSIS — Z1231 Encounter for screening mammogram for malignant neoplasm of breast: Secondary | ICD-10-CM | POA: Diagnosis not present

## 2022-03-09 DIAGNOSIS — R911 Solitary pulmonary nodule: Secondary | ICD-10-CM | POA: Diagnosis not present

## 2022-03-21 DIAGNOSIS — J45909 Unspecified asthma, uncomplicated: Secondary | ICD-10-CM | POA: Diagnosis not present

## 2022-03-21 DIAGNOSIS — J329 Chronic sinusitis, unspecified: Secondary | ICD-10-CM | POA: Diagnosis not present

## 2022-06-14 DIAGNOSIS — M722 Plantar fascial fibromatosis: Secondary | ICD-10-CM | POA: Diagnosis not present

## 2022-06-14 DIAGNOSIS — M79671 Pain in right foot: Secondary | ICD-10-CM | POA: Diagnosis not present

## 2022-06-14 DIAGNOSIS — M79672 Pain in left foot: Secondary | ICD-10-CM | POA: Diagnosis not present

## 2022-07-19 DIAGNOSIS — J069 Acute upper respiratory infection, unspecified: Secondary | ICD-10-CM | POA: Diagnosis not present

## 2022-12-18 ENCOUNTER — Ambulatory Visit: Payer: BC Managed Care – PPO | Admitting: Internal Medicine

## 2023-02-14 ENCOUNTER — Encounter: Payer: Self-pay | Admitting: Internal Medicine

## 2023-02-14 ENCOUNTER — Ambulatory Visit: Payer: BC Managed Care – PPO | Admitting: Internal Medicine

## 2023-02-14 VITALS — BP 132/86 | HR 79 | Temp 98.5°F | Resp 16 | Ht 62.0 in | Wt 188.3 lb

## 2023-02-14 DIAGNOSIS — Z122 Encounter for screening for malignant neoplasm of respiratory organs: Secondary | ICD-10-CM

## 2023-02-14 DIAGNOSIS — E039 Hypothyroidism, unspecified: Secondary | ICD-10-CM | POA: Diagnosis not present

## 2023-02-14 DIAGNOSIS — Z1231 Encounter for screening mammogram for malignant neoplasm of breast: Secondary | ICD-10-CM | POA: Diagnosis not present

## 2023-02-14 DIAGNOSIS — E782 Mixed hyperlipidemia: Secondary | ICD-10-CM

## 2023-02-14 DIAGNOSIS — J432 Centrilobular emphysema: Secondary | ICD-10-CM

## 2023-02-14 NOTE — Progress Notes (Signed)
New Patient Office Visit  Subjective    Patient ID: Alejandra Mcdaniel, female    DOB: 01-31-1961  Age: 62 y.o. MRN: [id number]  CC:  Chief Complaint  Patient presents with   Establish Care    HPI Alejandra Mcdaniel presents to establish care. She had been a patient here a few years ago and is wanting to re-establish care.   HLD: -Medications: Lipitor 80 mg -Patient is compliant with above medications and reports no side effects.  -Last lipid panel: Lipid Panel     Component Value Date/Time   CHOL 180 01/08/2020 1143   CHOL 180 04/29/2015 1628   TRIG 97 01/08/2020 1143   HDL 57 01/08/2020 1143   HDL 62 04/29/2015 1628   CHOLHDL 3.2 01/08/2020 1143   VLDL 30 10/08/2016 1545   LDLCALC 104 (H) 01/08/2020 1143   LABVLDL 23 04/29/2015 1628   Hypothyroidism: -Medications: Levothyroxine 88 mcg  -Patient is compliant with the above medication (s) at the above dose and reports no medication side effects.  -Denies weight changes, cold./heat intolerance, skin changes, anxiety/palpitations  -Last TSH: 11/23 0.099  Environmental Allergies: -Currently Zyrtec 10 mg which controls symptoms  COPD: -COPD status: controlled -Current medications: Albuterol PRN - not using very often although as symptoms  -Dyspnea frequency: frequently with activity  -Cough frequency: Daily but nonproductive -Rescue inhaler frequency: Monthly -Limitation of activity: Sometimes -Pneumovax: Not up to Date - discussed, patient will think about it, info printed for her to review -Influenza: Not up to Date - declines  Health Maintenance: -Blood work due -Mammogram Due -Colonoscopy 2016, repeat in 10 years -Pap 4/22 negative  Outpatient Encounter Medications as of 02/14/2023  Medication Sig   albuterol (VENTOLIN HFA) 108 (90 Base) MCG/ACT inhaler Inhale 2 puffs into the lungs every 4 (four) hours as needed for wheezing or shortness of breath.   aspirin 81 MG tablet Take 81 mg by mouth daily.    atorvastatin (LIPITOR) 80 MG tablet TAKE 1 TABLET AT BEDTIME   cetirizine (ZYRTEC) 10 MG tablet TAKE 1 TABLET BY MOUTH ONCE DAILY AS NEEDED FOR ALLERGIES   levothyroxine (SYNTHROID) 50 MCG tablet Take 1 1/2 tabs (75 mcg) po daily in the morning on empty stomach x 2 weeks, then take 2 tabs (100 mcg) daily in the morning on empty stomach do not eat breakfast for more than 30 min afterwards (Patient taking differently: Take 88 mcg by mouth daily before breakfast.)   [DISCONTINUED] guaiFENesin-codeine (ROBITUSSIN AC) 100-10 MG/5ML syrup Take 5 mLs by mouth 3 (three) times daily as needed for cough.   [DISCONTINUED] predniSONE (STERAPRED UNI-PAK 21 TAB) 10 MG (21) TBPK tablet Take 6 tablets on the first day and decrease by 1 tablet each day until finished.   No facility-administered encounter medications on file as of 02/14/2023.    Past Medical History:  Diagnosis Date   Aortic atherosclerosis (HCC) 01/07/2017   Noted on chest CT 2018   Dysrhythmia    occasional palpatations (secondary to thyroid disease)   Emphysema lung (HCC) 01/07/2017   Chest CT 2018   Hypercholesteremia    Hyperlipidemia    Hypothyroidism    PONV (postoperative nausea and vomiting)     Past Surgical History:  Procedure Laterality Date   CESAREAN SECTION  x2   COLONOSCOPY WITH PROPOFOL N/A 12/03/2014   Procedure: COLONOSCOPY WITH PROPOFOL;  Surgeon: Midge Minium, MD;  Location[facility];  Service: Endoscopy;  Laterality: N/A;   POLYPECTOMY  12/03/2014  Procedure: POLYPECTOMY;  Surgeon: Midge Minium, MD;  Location: [facility];  Service: Endoscopy;;   TONSILLECTOMY      Family History  Problem Relation Age of Onset   Heart disease Father    Diabetes Father    Hyperlipidemia Father    Thyroid disease Father    COPD Sister    Heart disease Sister    Diabetes Brother    Hypertension Brother    Hepatitis C Brother    Kidney disease Brother    Diabetes Sister    Cancer Mother        cervical    Heart disease Mother    Diabetes Mother    Hyperlipidemia Mother    Heart disease Maternal Grandfather    Cancer Brother        kidney    Breast cancer Paternal Aunt 19    Social History   Socioeconomic History   Marital status: Divorced    Spouse name: Not on file   Number of children: Not on file   Years of education: Not on file   Highest education level: Not on file  Occupational History   Not on file  Tobacco Use   Smoking status: Former    Current packs/day: 0.00    Average packs/day: 2.0 packs/day for 40.0 years (80.0 ttl pk-yrs)    Types: Cigarettes    Start date: 12/01/1972    Quit date: 12/01/2012    Years since quitting: 10.2   Smokeless tobacco: Never  Vaping Use   Vaping status: Former   Substances: Nicotine  Substance and Sexual Activity   Alcohol use: No   Drug use: No   Sexual activity: Not Currently  Other Topics Concern   Not on file  Social History Narrative   ** Merged History Encounter **       Social Determinants of Health   Financial Resource Strain: Low Risk  (11/03/2022)   Received from [facility]   Overall Financial Resource Strain (CARDIA)    Difficulty of Paying Living Expenses: Not hard at all  Food Insecurity: No Food Insecurity (11/03/2022)   Received from [facility]   Hunger Vital Sign    Worried About Running Out of Food in the Last Year: Never true    Ran Out of Food in the Last Year: Never true  Transportation Needs: No Transportation Needs (11/03/2022)   Received from [facility]   PRAPARE - Transportation    Lack of Transportation (Medical): No    Lack of Transportation (Non-Medical): No  Physical Activity: Not on file  Stress: Not on file  Social Connections: Not on file  Intimate Partner Violence: Not At Risk (01/15/2022)   Received from [facility]   Humiliation, Afraid, Rape, and Kick questionnaire    Fear of Current or Ex-Partner: No    Emotionally Abused: No    Physically Abused: No    Sexually  Abused: No    Review of Systems  All other systems reviewed and are negative.       Objective    BP 132/86   Pulse 79   Temp 98.5 F (36.9 C)   Resp 16   Ht 5\' 2"  (1.575 m)   Wt 188 lb 4.8 oz (85.4 kg)   SpO2 98%   BMI 34.44 kg/m   Physical Exam Constitutional:      Appearance: Normal appearance.  HENT:     Head: Normocephalic and atraumatic.     Mouth/Throat:  Mouth: Mucous membranes are moist.     Pharynx: Oropharynx is clear.  Eyes:     Extraocular Movements: Extraocular movements intact.     Conjunctiva/sclera: Conjunctivae normal.     Pupils: Pupils are equal, round, and reactive to light.  Neck:     Comments: No thyromegaly Cardiovascular:     Rate and Rhythm: Normal rate and regular rhythm.  Pulmonary:     Effort: Pulmonary effort is normal.     Breath sounds: Normal breath sounds.  Musculoskeletal:     Cervical back: No tenderness.     Right lower leg: No edema.     Left lower leg: No edema.  Lymphadenopathy:     Cervical: No cervical adenopathy.  Skin:    General: Skin is warm and dry.  Neurological:     General: No focal deficit present.     Mental Status: She is alert. Mental status is at baseline.  Psychiatric:        Mood and Affect: Mood normal.        Behavior: Behavior normal.     Last CBC Lab Results  Component Value Date   WBC 5.9 01/08/2020   HGB 14.1 01/08/2020   HCT 41.5 01/08/2020   MCV 87.0 01/08/2020   MCH 29.6 01/08/2020   RDW 12.3 01/08/2020   PLT 238 01/08/2020   Last metabolic panel Lab Results  Component Value Date   GLUCOSE 87 01/08/2020   NA 138 01/08/2020   K 4.9 01/08/2020   CL 102 01/08/2020   CO2 28 01/08/2020   BUN 19 01/08/2020   CREATININE 0.60 06/03/2020   GFRNONAA 97 01/08/2020   CALCIUM 9.9 01/08/2020   PROT 6.9 01/08/2020   ALBUMIN 4.3 10/08/2016   BILITOT 1.4 (H) 01/08/2020   ALKPHOS 55 10/08/2016   AST 20 01/08/2020   ALT 22 01/08/2020   ANIONGAP 6 12/02/2015   Last lipids Lab  Results  Component Value Date   CHOL 180 01/08/2020   HDL 57 01/08/2020   LDLCALC 104 (H) 01/08/2020   TRIG 97 01/08/2020   CHOLHDL 3.2 01/08/2020   Last hemoglobin A1c No results found for: "HGBA1C" Last thyroid functions Lab Results  Component Value Date   TSH 14.31 (H) 01/08/2020   Last vitamin D Lab Results  Component Value Date   VD25OH 21 (L) 10/08/2016   Last vitamin B12 and Folate No results found for: "VITAMINB12", "FOLATE"      Assessment & Plan:   1. Mixed hyperlipidemia: Repeat lipid panel, doing well on statin.   - Lipid Profile  2. Hypothyroidism, unspecified type: Repeat TSH today, labs from last year a little low.  Will refill thyroid medication after labs result.  - TSH  3. Centrilobular emphysema (HCC): I do not think patient needs a maintenance inhaler at this time, however I do think she needs to use her albuterol inhaler more often.  She does have albuterol at home.  Labs due.  Discussed pneumonia vaccine, patient will think about it.  - CBC w/Diff/Platelet - COMPLETE METABOLIC PANEL WITH GFR  4. Encounter for screening mammogram for malignant neoplasm of breast: Mammogram ordered.  - MM 3D SCREENING MAMMOGRAM BILATERAL BREAST; Future  5. Screening for lung cancer: Referral for lung cancer screening placed.  - Ambulatory Referral Lung Cancer Screening [facility]   Return in about 6 months (around 08/14/2023).   Margarita Mail, DO

## 2023-02-14 NOTE — Patient Instructions (Addendum)
It was great seeing you today!  Plan discussed at today's visit: -Blood work ordered today, results will be uploaded to MyChart.  -Mammogram ordered, please call to schedule -Referral placed for lung screening -Consider pneumonia vaccine, more info below   Follow up in: 6 months   Take care and let us know if you have any questions or concerns prior to your next visit.  Dr. Caralee Ates  Pneumococcal Conjugate Vaccine (PCV20) Injection What is this medication? PNEUMOCOCCAL CONJUGATE VACCINE (NEU mo KOK al kon ju gate vak SEEN) reduces the risk of pneumococcal disease, such as pneumonia. It does not treat pneumococcal disease. It is still possible to get pneumococcal disease after receiving this vaccine, but the symptoms may be less severe or not last as long. It works by helping your immune system learn how to fight off a future infection. This medicine may be used for other purposes; ask your health care provider or pharmacist if you have questions. COMMON BRAND NAME(S): Prevnar 20 What should I tell my care team before I take this medication? They need to know if you have any of these conditions: Bleeding disorder Fever Immune system problems An unusual or allergic reaction to pneumococcal vaccine, diphtheria toxoid, other vaccines, other medications, foods, dyes, or preservatives Pregnant or trying to get pregnant Breastfeeding How should I use this medication? This vaccine is injected into a muscle. It is given by your care team. A copy of Vaccine Information Statements will be given before each vaccination. Be sure to read this information carefully each time. This sheet may change often. Talk to your care team about the use of this medication in children. While it may be given to children as young as 6 weeks for selected conditions, precautions do apply. Overdosage: If you think you have taken too much of this medicine contact a poison control center or emergency room at once. NOTE:  This medicine is only for you. Do not share this medicine with others. What if I miss a dose? This does not apply. This medication is not for regular use. What may interact with this medication? Medications for cancer chemotherapy Medications that suppress your immune function Steroid medications, such as prednisone or cortisone This list may not describe all possible interactions. Give your health care provider a list of all the medicines, herbs, non-prescription drugs, or dietary supplements you use. Also tell them if you smoke, drink alcohol, or use illegal drugs. Some items may interact with your medicine. What should I watch for while using this medication? Visit your care team regularly. Report any side effects to your care team right away. This vaccine, like all vaccines, may not fully protect everyone. What side effects may I notice from receiving this medication? Side effects that you should report to your care team as soon as possible: Allergic reactions--skin rash, itching, hives, swelling of the face, lips, tongue, or throat Side effects that usually do not require medical attention (report these to your care team if they continue or are bothersome): Fatigue Fever Headache Joint pain Muscle pain Pain, redness, or irritation at injection site This list may not describe all possible side effects. Call your doctor for medical advice about side effects. You may report side effects to FDA at 1-800-FDA-1088. Where should I keep my medication? This vaccine is only given by your care team. It will not be stored at home. NOTE: This sheet is a summary. It may not cover all possible information. If you have questions about this medicine, talk  to your doctor, pharmacist, or health care provider.  2024 Elsevier/Gold Standard (2021-08-30 00:00:00)

## 2023-02-15 LAB — CBC WITH DIFFERENTIAL/PLATELET
Absolute Lymphocytes: 1643 {cells}/uL (ref 850–3900)
Absolute Monocytes: 578 {cells}/uL (ref 200–950)
Basophils Absolute: 21 {cells}/uL (ref 0–200)
Basophils Relative: 0.4 %
Eosinophils Absolute: 122 {cells}/uL (ref 15–500)
Eosinophils Relative: 2.3 %
HCT: 38.2 % (ref 35.0–45.0)
Hemoglobin: 12.5 g/dL (ref 11.7–15.5)
MCH: 28.3 pg (ref 27.0–33.0)
MCHC: 32.7 g/dL (ref 32.0–36.0)
MCV: 86.4 fL (ref 80.0–100.0)
MPV: 11.5 fL (ref 7.5–12.5)
Monocytes Relative: 10.9 %
Neutro Abs: 2936 {cells}/uL (ref 1500–7800)
Neutrophils Relative %: 55.4 %
Platelets: 205 10*3/uL (ref 140–400)
RBC: 4.42 10*6/uL (ref 3.80–5.10)
RDW: 12.4 % (ref 11.0–15.0)
Total Lymphocyte: 31 %
WBC: 5.3 10*3/uL (ref 3.8–10.8)

## 2023-02-15 LAB — COMPLETE METABOLIC PANEL WITH GFR
AG Ratio: 1.8 (calc) (ref 1.0–2.5)
ALT: 19 U/L (ref 6–29)
AST: 17 U/L (ref 10–35)
Albumin: 4.5 g/dL (ref 3.6–5.1)
Alkaline phosphatase (APISO): 100 U/L (ref 37–153)
BUN: 11 mg/dL (ref 7–25)
CO2: 30 mmol/L (ref 20–32)
Calcium: 10 mg/dL (ref 8.6–10.4)
Chloride: 103 mmol/L (ref 98–110)
Creat: 0.67 mg/dL (ref 0.50–1.05)
Globulin: 2.5 g/dL (ref 1.9–3.7)
Glucose, Bld: 90 mg/dL (ref 65–99)
Potassium: 4.3 mmol/L (ref 3.5–5.3)
Sodium: 139 mmol/L (ref 135–146)
Total Bilirubin: 1.2 mg/dL (ref 0.2–1.2)
Total Protein: 7 g/dL (ref 6.1–8.1)
eGFR: 98 mL/min/{1.73_m2} (ref >=60)

## 2023-02-15 LAB — LIPID PANEL
Cholesterol: 167 mg/dL (ref <200)
HDL: 49 mg/dL — ABNORMAL LOW (ref >=50)
LDL Cholesterol (Calc): 99 mg/dL
Non-HDL Cholesterol (Calc): 118 mg/dL (ref <130)
Total CHOL/HDL Ratio: 3.4 (calc) (ref <5.0)
Triglycerides: 92 mg/dL (ref <150)

## 2023-02-15 LAB — TSH: TSH: 2.5 m[IU]/L (ref 0.40–4.50)

## 2023-02-17 MED ORDER — LEVOTHYROXINE SODIUM 88 MCG PO TABS: 88.0000 ug | ORAL_TABLET | Freq: Every day | ORAL | 1 refills | Status: DC

## 2023-02-17 NOTE — Addendum Note (Signed)
Addended by: Margarita Mail on: 02/17/2023 06:18 PM   Modules accepted: Orders

## 2023-04-08 ENCOUNTER — Telehealth: Payer: Self-pay

## 2023-04-08 NOTE — Telephone Encounter (Signed)
Patient would like CT scheduled.

## 2023-04-08 NOTE — Telephone Encounter (Signed)
 Copied from CRM [id number]. Topic: Referral - Request for Referral >> Apr 08, 2023  1:43 PM Victoria B wrote: Reason for CRM: lung screening  Did the patient discuss referral with their provider in the last year? yes yes    Type of order/referral and detailed reason for visit: lung scan, pt is a smoker and gets this done every year  Preference of office, provider, location: [place] location  If referral order, have you been seen by this specialty before? Yes, gets done every year   Can we respond through MyChart? yes

## 2023-04-09 ENCOUNTER — Other Ambulatory Visit: Payer: Self-pay | Admitting: Internal Medicine

## 2023-04-09 ENCOUNTER — Other Ambulatory Visit: Payer: Self-pay

## 2023-04-09 ENCOUNTER — Telehealth: Payer: Self-pay

## 2023-04-09 DIAGNOSIS — Z122 Encounter for screening for malignant neoplasm of respiratory organs: Secondary | ICD-10-CM

## 2023-04-09 DIAGNOSIS — Z87891 Personal history of nicotine dependence: Secondary | ICD-10-CM

## 2023-04-09 NOTE — Telephone Encounter (Signed)
 Left message for patient along with number for her to call and schedule if she do not hear from them this week [phone]

## 2023-04-09 NOTE — Telephone Encounter (Signed)
.  Lung Cancer Screening Narrative/Criteria Questionnaire (Cigarette Smokers Only- No Cigars/Pipes/vapes)   Alejandra Mcdaniel     SDMV:04/17/2023@ 9:00am  1960/07/06              LDCT: 04/19/2023 @12 :00pm    62 y.o.   Phone: [phone]  Lung Screening Narrative   Before calling, confirm age (50-77 yrs Medicare / 50-80 yrs Private pay insurance)  Insurance information: BCBS   I am calling at the request of Dr. Bernardo (referring provider) to schedule you for a lung screening.  Did your provider discuss this with you? Yes   This screening involves an initial meeting with our NP, who is the Nurse Navigator for the program.  It is called a shared decision making visit.  The initial meeting is required by insurance and Medicare to make sure you understand the program.  This appointment takes about 20 minutes to complete.  After you have spoken with the provider, we will schedule you for your screening scan.  This scan takes about 5-10 minutes to complete.  You can eat and drink normally before and after the scan.    I am going to ask you a few questions to make sure you meet the criteria to participate in the program.    Are you a current or former smoker? Former Age began smoking: 7   If you are a former smoker, what year did you quit smoking? 2013 (must be within 15 yrs)    To calculate your smoking history, I need an accurate estimate of how many packs of cigarettes you smoked per day and   for how many years.    Years smoking 42 x Packs per day 2.5 = Pack years 105   (at least 20 pack yrs)   (Make sure they understand that we need to know how much they have smoked in the past, not just the number of PPD they are smoking now)  Do you have a personal history of cancer?  No    Do you have a family history of cancer? Yes  (cancer type and and relative) Mother-cervical   Are you having any of the following symptoms?  Coughing up blood?  No  Weight loss of 15 lbs or more in the last 6 months  without trying / you cannot explain  Yes  It looks like you meet all criteria.  When would be a good time for us  to schedule you for this screening?   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 If she does not answer, please give her  a minute or two and she will be available.

## 2023-04-17 ENCOUNTER — Encounter: Payer: Self-pay | Admitting: Adult Health

## 2023-04-17 ENCOUNTER — Ambulatory Visit: Payer: BC Managed Care – PPO | Admitting: Adult Health

## 2023-04-17 DIAGNOSIS — Z87891 Personal history of nicotine dependence: Secondary | ICD-10-CM

## 2023-04-17 NOTE — Progress Notes (Signed)
  Virtual Visit via Telephone Note  I connected with Mesa Mehrhoff Earnhart , 04/17/23 9:08 AM by a telemedicine application and verified that I am speaking with the correct person using two identifiers.  Location: Patient: home Provider: home   I discussed the limitations of evaluation and management by telemedicine and the availability of in person appointments. The patient expressed understanding and agreed to proceed.   Shared Decision Making Visit Lung Cancer Screening Program [phone])   Eligibility: 63 y.o. Pack Years Smoking History Calculation =105 pack years (# packs/per year x # years smoked) Recent History of coughing up blood  no Unexplained weight loss? no ( >Than 15 pounds within the last 6 months ) Prior History Lung / other cancer no (Diagnosis within the last 5 years already requiring surveillance chest CT Scans). Smoking Status Former Smoker Former Smokers: Years since quit: 12 years  Quit Date: 2013  Visit Components: Discussion included one or more decision making aids. YES Discussion included risk/benefits of screening. YES Discussion included potential follow up diagnostic testing for abnormal scans. YES Discussion included meaning and risk of over diagnosis. YES Discussion included meaning and risk of False Positives. YES Discussion included meaning of total radiation exposure. YES  Counseling Included: Importance of adherence to annual lung cancer LDCT screening. YES Impact of comorbidities on ability to participate in the program. YES Ability and willingness to under diagnostic treatment. YES  Smoking Cessation Counseling:  Former Smokers:  Discussed the importance of maintaining cigarette abstinence. yes Diagnosis Code: Personal History of Nicotine Dependence. S85.462 Information about tobacco cessation classes and interventions provided to patient. Yes Patient provided with "ticket" for LDCT Scan. yes Written Order for Lung Cancer Screening with  LDCT placed in Epic. Yes (CT Chest Lung Cancer Screening Low Dose W/O CM) [id number]  Z12.2-Screening of respiratory organs Z87.891-Personal history of nicotine dependence   [place] Dose 04/17/23

## 2023-04-17 NOTE — Patient Instructions (Signed)

## 2023-04-19 ENCOUNTER — Ambulatory Visit
Admission: RE | Admit: 2023-04-19 | Discharge: 2023-04-19 | Disposition: A | Discharge status: Home / Self Care | Payer: BC Managed Care – PPO | Source: Ambulatory Visit | Attending: Internal Medicine

## 2023-04-19 ENCOUNTER — Ambulatory Visit
Admission: RE | Admit: 2023-04-19 | Discharge: 2023-04-19 | Disposition: A | Discharge status: Home / Self Care | Payer: BC Managed Care – PPO | Source: Ambulatory Visit | Attending: Acute Care | Admitting: Acute Care

## 2023-04-19 DIAGNOSIS — Z87891 Personal history of nicotine dependence: Secondary | ICD-10-CM | POA: Insufficient documentation

## 2023-04-19 DIAGNOSIS — Z1231 Encounter for screening mammogram for malignant neoplasm of breast: Secondary | ICD-10-CM | POA: Diagnosis not present

## 2023-04-19 DIAGNOSIS — M722 Plantar fascial fibromatosis: Secondary | ICD-10-CM | POA: Diagnosis not present

## 2023-04-19 DIAGNOSIS — M79671 Pain in right foot: Secondary | ICD-10-CM | POA: Diagnosis not present

## 2023-04-19 DIAGNOSIS — M79672 Pain in left foot: Secondary | ICD-10-CM | POA: Diagnosis not present

## 2023-04-19 DIAGNOSIS — M2012 Hallux valgus (acquired), left foot: Secondary | ICD-10-CM | POA: Diagnosis not present

## 2023-04-19 DIAGNOSIS — Z122 Encounter for screening for malignant neoplasm of respiratory organs: Secondary | ICD-10-CM | POA: Diagnosis not present

## 2023-04-23 ENCOUNTER — Encounter: Payer: Self-pay | Admitting: Internal Medicine

## 2023-04-29 ENCOUNTER — Other Ambulatory Visit: Payer: Self-pay

## 2023-04-29 DIAGNOSIS — Z122 Encounter for screening for malignant neoplasm of respiratory organs: Secondary | ICD-10-CM

## 2023-04-29 DIAGNOSIS — Z87891 Personal history of nicotine dependence: Secondary | ICD-10-CM

## 2023-05-09 DIAGNOSIS — M79671 Pain in right foot: Secondary | ICD-10-CM | POA: Diagnosis not present

## 2023-05-09 DIAGNOSIS — M79672 Pain in left foot: Secondary | ICD-10-CM | POA: Diagnosis not present

## 2023-05-09 DIAGNOSIS — M2012 Hallux valgus (acquired), left foot: Secondary | ICD-10-CM | POA: Diagnosis not present

## 2023-05-09 DIAGNOSIS — M722 Plantar fascial fibromatosis: Secondary | ICD-10-CM | POA: Diagnosis not present

## 2023-09-07 ENCOUNTER — Encounter: Payer: Self-pay | Admitting: Internal Medicine

## 2023-09-09 ENCOUNTER — Other Ambulatory Visit: Payer: Self-pay

## 2023-09-09 ENCOUNTER — Telehealth: Payer: Self-pay | Admitting: Internal Medicine

## 2023-09-09 DIAGNOSIS — E039 Hypothyroidism, unspecified: Secondary | ICD-10-CM

## 2023-09-09 MED ORDER — LEVOTHYROXINE SODIUM 88 MCG PO TABS
88.0000 ug | ORAL_TABLET | Freq: Every day | ORAL | 0 refills | Status: DC
Start: 2023-09-09 — End: 2023-12-12

## 2023-09-09 MED ORDER — ATORVASTATIN CALCIUM 80 MG PO TABS: 80.0000 mg | ORAL_TABLET | Freq: Every day | ORAL | 0 refills | Status: DC

## 2023-09-09 NOTE — Telephone Encounter (Signed)
atorvastatin (LIPITOR) 80 MG tablet ° °

## 2023-09-09 NOTE — Telephone Encounter (Signed)
cetirizine (ZYRTEC) 10 MG tablet ?

## 2023-09-09 NOTE — Telephone Encounter (Signed)
 Erica the pharmacist at [facility] called to make sure the provider received request to refill the patients atorvastatin  (LIPITOR) 80 MG tablet as well as her cetirizine  (ZYRTEC ) 10 MG tablet . The patient has only been seen one time and it was back in November 2024. Please assist patient by sending authorized refills to   [facility] [address]), [place] - [address] Phone: [phone]  Fax: [phone]

## 2023-09-09 NOTE — Telephone Encounter (Signed)
 [facility] to refill

## 2023-09-19 ENCOUNTER — Ambulatory Visit: Admitting: Internal Medicine

## 2023-09-19 VITALS — BP 132/80 | HR 79 | Temp 98.4°F | Resp 18 | Ht 62.0 in | Wt 182.2 lb

## 2023-09-19 DIAGNOSIS — E782 Mixed hyperlipidemia: Secondary | ICD-10-CM | POA: Diagnosis not present

## 2023-09-19 DIAGNOSIS — M1990 Unspecified osteoarthritis, unspecified site: Secondary | ICD-10-CM

## 2023-09-19 DIAGNOSIS — J302 Other seasonal allergic rhinitis: Secondary | ICD-10-CM

## 2023-09-19 DIAGNOSIS — J432 Centrilobular emphysema: Secondary | ICD-10-CM

## 2023-09-19 DIAGNOSIS — M722 Plantar fascial fibromatosis: Secondary | ICD-10-CM

## 2023-09-19 DIAGNOSIS — E039 Hypothyroidism, unspecified: Secondary | ICD-10-CM | POA: Diagnosis not present

## 2023-09-19 MED ORDER — CELECOXIB 50 MG PO CAPS: 50.0000 mg | ORAL_CAPSULE | Freq: Two times a day (BID) | ORAL | 3 refills | Status: AC

## 2023-09-19 MED ORDER — ATORVASTATIN CALCIUM 80 MG PO TABS: 80.0000 mg | ORAL_TABLET | Freq: Every day | ORAL | 3 refills | Status: AC

## 2023-09-19 MED ORDER — ALBUTEROL SULFATE HFA 108 (90 BASE) MCG/ACT IN AERS: 2.0000 | INHALATION_SPRAY | RESPIRATORY_TRACT | 3 refills | Status: DC | PRN

## 2023-09-19 MED ORDER — CETIRIZINE HCL 10 MG PO TABS: 10.0000 mg | ORAL_TABLET | Freq: Every day | ORAL | 3 refills | Status: AC

## 2023-09-19 NOTE — Progress Notes (Signed)
 Established Patient Office Visit  Subjective    Patient ID: Alejandra Mcdaniel, female    DOB: Aug 07, 1960  Age: 63 y.o. MRN: [id number]  CC:  Chief Complaint  Patient presents with   Medical Management of Chronic Issues    HPI Alejandra Mcdaniel presents to follow up on chronic medical conditions.   Discussed the use of AI scribe software for clinical note transcription with the patient, who gave verbal consent to proceed.  History of Present Illness  Alejandra Mcdaniel is a 63 year old female who presents for a routine follow-up and medication management.  She experiences significant pain, stiffness, and aching from plantar fasciitis, especially in the mornings. She previously found relief with Celebrex and is interested in resuming it. Currently, she takes four to six ibuprofen  at a time for pain management.  Her current medications include Lipitor and an albuterol  inhaler, which she uses infrequently but needs a refill as it is outdated. She also takes Zyrtec  for allergies and prefers to obtain it through her insurance.  Her last set of labs in November were satisfactory, and she is prepared for lab work today. Her last mammogram in January was normal. She is due for a colonoscopy and Pap smear next year and a pneumonia vaccine.   HLD: -Medications: Lipitor 80 mg -Patient is compliant with above medications and reports no side effects.  -Last lipid panel: Lipid Panel     Component Value Date/Time   CHOL 167 02/14/2023 1445   CHOL 180 04/29/2015 1628   TRIG 92 02/14/2023 1445   HDL 49 (L) 02/14/2023 1445   HDL 62 04/29/2015 1628   CHOLHDL 3.4 02/14/2023 1445   VLDL 30 10/08/2016 1545   LDLCALC 99 02/14/2023 1445   LABVLDL 23 04/29/2015 1628   Hypothyroidism: -Medications: Levothyroxine  88 mcg  -Patient is compliant with the above medication (s) at the above dose and reports no medication side effects.  -Denies weight changes, cold./heat intolerance, skin changes,  anxiety/palpitations  -Last TSH: 11/24 2.50  Environmental Allergies: -Currently Zyrtec  10 mg which controls symptoms  COPD: -COPD status: controlled -Current medications: Albuterol  PRN - not using very often although as symptoms  -Dyspnea frequency: frequently with activity  -Cough frequency: Daily but nonproductive -Rescue inhaler frequency: Monthly -Limitation of activity: Sometimes -Pneumovax: Not up to Date - discussed, patient not interested today -Influenza: Not up to Date - declines  Arthritis/Plantar Fascitis -Patient had been seen by Podiatry who gave her Celebrex which really helped all of her pain -No taking Ibuprofen  600-800 mg but still having arthritis pain  Health Maintenance: -Blood work due -Mammogram 1/25 Birads-1 -Colonoscopy 2016, repeat in 10 years -Pap 4/22 negative  Outpatient Encounter Medications as of 09/19/2023  Medication Sig   albuterol  (VENTOLIN  HFA) 108 (90 Base) MCG/ACT inhaler Inhale 2 puffs into the lungs every 4 (four) hours as needed for wheezing or shortness of breath.   aspirin 81 MG tablet Take 81 mg by mouth daily.   atorvastatin  (LIPITOR) 80 MG tablet Take 1 tablet (80 mg total) by mouth at bedtime.   cetirizine  (ZYRTEC ) 10 MG tablet TAKE 1 TABLET BY MOUTH ONCE DAILY AS NEEDED FOR ALLERGIES   levothyroxine  (SYNTHROID ) 88 MCG tablet Take 1 tablet (88 mcg total) by mouth daily.   No facility-administered encounter medications on file as of 09/19/2023.    Past Medical History:  Diagnosis Date   Aortic atherosclerosis (HCC) 01/07/2017   Noted on chest CT 2018   Dysrhythmia  occasional palpatations (secondary to thyroid  disease)   Emphysema lung (HCC) 01/07/2017   Chest CT 2018   Hypercholesteremia    Hyperlipidemia    Hypothyroidism    PONV (postoperative nausea and vomiting)     Past Surgical History:  Procedure Laterality Date   CESAREAN SECTION  x2   COLONOSCOPY WITH PROPOFOL  N/A 12/03/2014   Procedure: COLONOSCOPY WITH  PROPOFOL ;  Surgeon: Marnee Sink, MD;  Location: [facility];  Service: Endoscopy;  Laterality: N/A;   POLYPECTOMY  12/03/2014   Procedure: POLYPECTOMY;  Surgeon: Marnee Sink, MD;  Location: [facility];  Service: Endoscopy;;   TONSILLECTOMY      Family History  Problem Relation Age of Onset   Heart disease Father    Diabetes Father    Hyperlipidemia Father    Thyroid  disease Father    COPD Sister    Heart disease Sister    Diabetes Brother    Hypertension Brother    Hepatitis C Brother    Kidney disease Brother    Diabetes Sister    Cancer Mother        cervical   Heart disease Mother    Diabetes Mother    Hyperlipidemia Mother    Heart disease Maternal Grandfather    Cancer Brother        kidney    Breast cancer Paternal Aunt 75    Social History   Socioeconomic History   Marital status: Divorced    Spouse name: Not on file   Number of children: Not on file   Years of education: Not on file   Highest education level: 8th grade  Occupational History   Not on file  Tobacco Use   Smoking status: Former    Current packs/day: 0.00    Average packs/day: 2.0 packs/day for 40.0 years (80.0 ttl pk-yrs)    Types: Cigarettes    Start date: 12/01/1972    Quit date: 12/01/2012    Years since quitting: 10.8   Smokeless tobacco: Never  Vaping Use   Vaping status: Former   Substances: Nicotine  Substance and Sexual Activity   Alcohol use: No   Drug use: No   Sexual activity: Not Currently  Other Topics Concern   Not on file  Social History Narrative   ** Merged History Encounter **       Social Drivers of Health   Financial Resource Strain: Low Risk  (09/19/2023)   Overall Financial Resource Strain (CARDIA)    Difficulty of Paying Living Expenses: Not hard at all  Food Insecurity: No Food Insecurity (09/19/2023)   Hunger Vital Sign    Worried About Running Out of Food in the Last Year: Never true    Ran Out of Food in the Last Year: Never true   Transportation Needs: No Transportation Needs (09/19/2023)   PRAPARE - Administrator, Civil Service (Medical): No    Lack of Transportation (Non-Medical): No  Physical Activity: Unknown (09/19/2023)   Exercise Vital Sign    Days of Exercise per Week: Patient declined    Minutes of Exercise per Session: Not on file  Stress: Stress Concern Present (09/19/2023)   [employer] of Occupational Health - Occupational Stress Questionnaire    Feeling of Stress: Very much  Social Connections: Unknown (09/19/2023)   Social Connection and Isolation Panel    Frequency of Communication with Friends and Family: More than three times a week    Frequency of Social Gatherings with Friends and Family:  More than three times a week    Attends Religious Services: Patient declined    Active Member of Clubs or Organizations: No    Attends Banker Meetings: Not on file    Marital Status: Patient declined  Intimate Partner Violence: Not At Risk (01/15/2022)   Received from [facility]   Humiliation, Afraid, Rape, and Kick questionnaire    Within the last year, have you been afraid of your partner or ex-partner?: No    Within the last year, have you been humiliated or emotionally abused in other ways by your partner or ex-partner?: No    Within the last year, have you been kicked, hit, slapped, or otherwise physically hurt by your partner or ex-partner?: No    Within the last year, have you been raped or forced to have any kind of sexual activity by your partner or ex-partner?: No    Review of Systems  Musculoskeletal:  Positive for joint pain.  All other systems reviewed and are negative.       Objective    BP 132/80   Pulse 79   Temp 98.4 F (36.9 C)   Resp 18   Ht 5' 2 (1.575 m)   Wt 182 lb 3.2 oz (82.6 kg)   SpO2 99%   BMI 33.32 kg/m   Physical Exam Constitutional:      Appearance: Normal appearance.  HENT:     Head: Normocephalic and atraumatic.      Mouth/Throat:     Mouth: Mucous membranes are moist.     Pharynx: Oropharynx is clear.   Eyes:     Extraocular Movements: Extraocular movements intact.     Conjunctiva/sclera: Conjunctivae normal.     Pupils: Pupils are equal, round, and reactive to light.   Neck:     Comments: No thyromegaly Cardiovascular:     Rate and Rhythm: Normal rate and regular rhythm.  Pulmonary:     Effort: Pulmonary effort is normal.     Breath sounds: Normal breath sounds.   Musculoskeletal:     Cervical back: No tenderness.  Lymphadenopathy:     Cervical: No cervical adenopathy.   Skin:    General: Skin is warm and dry.   Neurological:     General: No focal deficit present.     Mental Status: She is alert. Mental status is at baseline.   Psychiatric:        Mood and Affect: Mood normal.        Behavior: Behavior normal.     Last CBC Lab Results  Component Value Date   WBC 5.3 02/14/2023   HGB 12.5 02/14/2023   HCT 38.2 02/14/2023   MCV 86.4 02/14/2023   MCH 28.3 02/14/2023   RDW 12.4 02/14/2023   PLT 205 02/14/2023   Last metabolic panel Lab Results  Component Value Date   GLUCOSE 90 02/14/2023   NA 139 02/14/2023   K 4.3 02/14/2023   CL 103 02/14/2023   CO2 30 02/14/2023   BUN 11 02/14/2023   CREATININE 0.67 02/14/2023   GFRNONAA 97 01/08/2020   CALCIUM  10.0 02/14/2023   PROT 7.0 02/14/2023   ALBUMIN 4.3 10/08/2016   BILITOT 1.2 02/14/2023   ALKPHOS 55 10/08/2016   AST 17 02/14/2023   ALT 19 02/14/2023   ANIONGAP 6 12/02/2015   Last lipids Lab Results  Component Value Date   CHOL 167 02/14/2023   HDL 49 (L) 02/14/2023   LDLCALC 99 02/14/2023   TRIG 92  02/14/2023   CHOLHDL 3.4 02/14/2023   Last hemoglobin A1c No results found for: HGBA1C Last thyroid  functions Lab Results  Component Value Date   TSH 2.50 02/14/2023   Last vitamin D  Lab Results  Component Value Date   VD25OH 21 (L) 10/08/2016   Last vitamin B12 and Folate No results found for:  VITAMINB12, FOLATE      Assessment & Plan:   Assessment & Plan Plantar Fasciitis/Arthritis  Chronic plantar fasciitis with significant morning pain and stiffness. Discussed long-term Celebrex risks, including kidney damage and gastrointestinal issues. Advised against combining with other NSAIDs. - Prescribe Celebrex 50 mg twice daily as needed, increase to 100 mg if necessary. - Instruct to take Celebrex with food and avoid other NSAIDs. - Allow acetaminophen  for additional pain relief.  COPD COPD with outdated albuterol  inhaler. Advised to have multiple inhalers for accessibility. - Prescribe new albuterol  inhaler with refills. - Advise to keep one inhaler at home and one in purse.  Allergic Rhinitis Allergic rhinitis managed with cetirizine . Prefers prescription for cost benefits. - Prescribe cetirizine .  Hyperlipidemia Hyperlipidemia managed with atorvastatin . Previous labs satisfactory. Recheck labs today. - Send refill for atorvastatin .  Hypothyroidism Symptoms stable, recheck thyroid  function today. Refill Levothyroxine  after labs result.  General Health Maintenance Routine health maintenance discussed. Recent mammogram normal. Due for colonoscopy and Pap smear next year. Discussed vaccines, no immediate plans. Annual labs to align with schedule. - Perform annual labs including thyroid , cholesterol, kidney, liver, and electrolytes. - Plan for colonoscopy and Pap smear next year. - Discuss pneumonia and shingles vaccines if interested.  Follow-up Plan to follow up in one year for routine evaluation unless issues arise. Labs will guide medication adjustments. - Schedule follow-up appointment in one year.  - TSH - atorvastatin  (LIPITOR) 80 MG tablet; Take 1 tablet (80 mg total) by mouth at bedtime.  Dispense: 90 tablet; Refill: 3 - Lipid Profile - albuterol  (VENTOLIN  HFA) 108 (90 Base) MCG/ACT inhaler; Inhale 2 puffs into the lungs every 4 (four) hours as needed for  wheezing or shortness of breath.  Dispense: 2 each; Refill: 3 - cetirizine  (ZYRTEC ) 10 MG tablet; Take 1 tablet (10 mg total) by mouth daily.  Dispense: 90 tablet; Refill: 3 - celecoxib (CELEBREX) 50 MG capsule; Take 1 capsule (50 mg total) by mouth 2 (two) times daily.  Dispense: 180 capsule; Refill: 3 - CBC w/Diff/Platelet - Comprehensive Metabolic Panel (CMET)   Return in about 1 year (around 09/18/2024) for physical w/Pap.   Rockney Cid, DO

## 2023-09-20 ENCOUNTER — Ambulatory Visit: Payer: Self-pay | Admitting: Internal Medicine

## 2023-09-20 LAB — CBC WITH DIFFERENTIAL/PLATELET
Absolute Lymphocytes: 1818 {cells}/uL (ref 850–3900)
Absolute Monocytes: 428 {cells}/uL (ref 200–950)
Basophils Absolute: 23 {cells}/uL (ref 0–200)
Basophils Relative: 0.4 %
Eosinophils Absolute: 131 {cells}/uL (ref 15–500)
Eosinophils Relative: 2.3 %
HCT: 43 % (ref 35.0–45.0)
Hemoglobin: 13.9 g/dL (ref 11.7–15.5)
MCH: 28.5 pg (ref 27.0–33.0)
MCHC: 32.3 g/dL (ref 32.0–36.0)
MCV: 88.1 fL (ref 80.0–100.0)
MPV: 11.5 fL (ref 7.5–12.5)
Monocytes Relative: 7.5 %
Neutro Abs: 3300 {cells}/uL (ref 1500–7800)
Neutrophils Relative %: 57.9 %
Platelets: 245 10*3/uL (ref 140–400)
RBC: 4.88 10*6/uL (ref 3.80–5.10)
RDW: 12.1 % (ref 11.0–15.0)
Total Lymphocyte: 31.9 %
WBC: 5.7 10*3/uL (ref 3.8–10.8)

## 2023-09-20 LAB — LIPID PANEL
Cholesterol: 177 mg/dL (ref <200)
HDL: 55 mg/dL (ref >=50)
LDL Cholesterol (Calc): 99 mg/dL
Non-HDL Cholesterol (Calc): 122 mg/dL (ref <130)
Total CHOL/HDL Ratio: 3.2 (calc) (ref <5.0)
Triglycerides: 125 mg/dL (ref <150)

## 2023-09-20 LAB — COMPREHENSIVE METABOLIC PANEL WITH GFR
AG Ratio: 2 (calc) (ref 1.0–2.5)
ALT: 24 U/L (ref 6–29)
AST: 19 U/L (ref 10–35)
Albumin: 4.5 g/dL (ref 3.6–5.1)
Alkaline phosphatase (APISO): 79 U/L (ref 37–153)
BUN: 12 mg/dL (ref 7–25)
CO2: 30 mmol/L (ref 20–32)
Calcium: 9.9 mg/dL (ref 8.6–10.4)
Chloride: 101 mmol/L (ref 98–110)
Creat: 0.72 mg/dL (ref 0.50–1.05)
Globulin: 2.2 g/dL (ref 1.9–3.7)
Glucose, Bld: 89 mg/dL (ref 65–99)
Potassium: 4.7 mmol/L (ref 3.5–5.3)
Sodium: 139 mmol/L (ref 135–146)
Total Bilirubin: 1.3 mg/dL — ABNORMAL HIGH (ref 0.2–1.2)
Total Protein: 6.7 g/dL (ref 6.1–8.1)
eGFR: 94 mL/min/{1.73_m2} (ref >=60)

## 2023-09-20 LAB — TSH: TSH: 0.43 m[IU]/L (ref 0.40–4.50)

## 2023-12-11 ENCOUNTER — Other Ambulatory Visit: Payer: Self-pay | Admitting: Internal Medicine

## 2023-12-11 DIAGNOSIS — E039 Hypothyroidism, unspecified: Secondary | ICD-10-CM

## 2023-12-12 NOTE — Telephone Encounter (Signed)
 Requested Prescriptions  Pending Prescriptions Disp Refills   levothyroxine  (SYNTHROID ) 88 MCG tablet [Pharmacy Med Name: Levothyroxine  Sodium 88 MCG Oral Tablet] 90 tablet 2    Sig: Take 1 tablet by mouth once daily     Endocrinology:  Hypothyroid Agents Passed - 12/12/2023 12:15 PM      Passed - TSH in normal range and within 360 days    TSH  Date Value Ref Range Status  09/19/2023 0.43 0.40 - 4.50 mIU/L Final         Passed - Valid encounter within last 12 months    Recent Outpatient Visits           2 months ago Hypothyroidism, unspecified type   [facility] Bernardo Fend, DO       Future Appointments             In 9 months Bernardo Fend, DO [facility] [place]

## 2024-04-07 ENCOUNTER — Other Ambulatory Visit: Payer: Self-pay | Admitting: Internal Medicine

## 2024-04-07 DIAGNOSIS — Z1231 Encounter for screening mammogram for malignant neoplasm of breast: Secondary | ICD-10-CM

## 2024-04-24 ENCOUNTER — Ambulatory Visit
Admission: RE | Admit: 2024-04-24 | Discharge: 2024-04-24 | Disposition: A | Source: Ambulatory Visit | Attending: Internal Medicine

## 2024-04-24 ENCOUNTER — Ambulatory Visit
Admission: RE | Admit: 2024-04-24 | Discharge: 2024-04-24 | Disposition: A | Source: Ambulatory Visit | Attending: Acute Care | Admitting: Acute Care

## 2024-04-24 DIAGNOSIS — Z1231 Encounter for screening mammogram for malignant neoplasm of breast: Secondary | ICD-10-CM | POA: Diagnosis present

## 2024-04-24 DIAGNOSIS — Z87891 Personal history of nicotine dependence: Secondary | ICD-10-CM | POA: Insufficient documentation

## 2024-04-24 DIAGNOSIS — Z122 Encounter for screening for malignant neoplasm of respiratory organs: Secondary | ICD-10-CM | POA: Insufficient documentation

## 2024-04-28 ENCOUNTER — Ambulatory Visit: Payer: Self-pay | Admitting: Internal Medicine

## 2024-04-29 ENCOUNTER — Ambulatory Visit: Admitting: Internal Medicine

## 2024-04-29 ENCOUNTER — Telehealth: Admitting: Internal Medicine

## 2024-04-29 DIAGNOSIS — J069 Acute upper respiratory infection, unspecified: Secondary | ICD-10-CM | POA: Diagnosis not present

## 2024-04-29 DIAGNOSIS — J441 Chronic obstructive pulmonary disease with (acute) exacerbation: Secondary | ICD-10-CM | POA: Diagnosis not present

## 2024-04-29 DIAGNOSIS — J329 Chronic sinusitis, unspecified: Secondary | ICD-10-CM

## 2024-04-29 DIAGNOSIS — R051 Acute cough: Secondary | ICD-10-CM

## 2024-04-29 MED ORDER — PREDNISONE 20 MG PO TABS: 40.0000 mg | ORAL_TABLET | Freq: Every day | ORAL | 0 refills | Status: AC

## 2024-04-29 MED ORDER — BENZONATATE 100 MG PO CAPS: 100.0000 mg | ORAL_CAPSULE | Freq: Two times a day (BID) | ORAL | 0 refills | Status: AC | PRN

## 2024-04-29 MED ORDER — ALBUTEROL SULFATE HFA 108 (90 BASE) MCG/ACT IN AERS: 2.0000 | INHALATION_SPRAY | RESPIRATORY_TRACT | 3 refills | Status: AC | PRN

## 2024-04-29 MED ORDER — DOXYCYCLINE HYCLATE 100 MG PO TABS: 100.0000 mg | ORAL_TABLET | Freq: Two times a day (BID) | ORAL | 0 refills | Status: AC

## 2024-04-29 NOTE — Progress Notes (Signed)
 Virtual Visit via Video Note  I connected with Alejandra Mcdaniel on 04/29/24 at  9:40 AM EST by a video enabled telemedicine application and verified that I am speaking with the correct person using two identifiers.  Location: Patient: Home Provider: [facility]   I discussed the limitations of evaluation and management by telemedicine and the availability of in person appointments. The patient expressed understanding and agreed to proceed.  History of Present Illness:  Discussed the use of AI scribe software for clinical note transcription with the patient, who gave verbal consent to proceed.  History of Present Illness Alejandra Mcdaniel is a 64 year old female with emphysema who presents with respiratory symptoms.  For four days she has had a non-productive cough, deep chest pain, and shortness of breath that worsens with activity. She wheezes in the mornings and at night with partial relief from albuterol . She had fevers for three days, but none today. She also has congestion, sinus pain, and morning sore throat that makes speaking difficult. She has emphysema and uses albuterol  as needed. She has tolerated steroids before and has no antibiotic allergies. She has not done home flu or COVID tests.    Observations/Objective:  General: well appearing, no acute distress ENT: conjunctiva normal appearing bilaterally, congested sounding Pulm: dry cough heard via telemedicine Skin: no rashes, cyanosis or abnormal bruising noted Neuro: answers all questions appropriately   Assessment and Plan: Assessment & Plan Acute exacerbation of centrilobular emphysema (COPD) Acute exacerbation likely triggered by viral infection, with increased dyspnea, wheezing, and cough. Risk of complications due to underlying COPD. - Continue albuterol  inhaler every 4-6 hours as needed. - Prescribed prednisone  40 mg for 5 days. - Advised to report issues with prednisone  such as increased appetite, weight gain, elevated  blood sugar, or mood changes. - Instructed to return for evaluation if symptoms do not improve.  Acute upper respiratory infection with sinusitis Symptoms include sinus congestion, sinus pain, and sore throat, likely viral. Differential includes rhinovirus or adenovirus. No current evidence of pneumonia, but prophylactic antibiotic treatment considered due to COPD. - Prescribed doxycycline  to prevent pneumonia and address symptoms. - Prescribed cough medicine. - Advised to rest and stay hydrated. - Instructed to return for evaluation if symptoms do not improve by next week.  - predniSONE  (DELTASONE ) 20 MG tablet; Take 2 tablets (40 mg total) by mouth daily with breakfast for 5 days.  Dispense: 10 tablet; Refill: 0 - doxycycline  (VIBRA -TABS) 100 MG tablet; Take 1 tablet (100 mg total) by mouth 2 (two) times daily for 7 days.  Dispense: 14 tablet; Refill: 0 - benzonatate  (TESSALON ) 100 MG capsule; Take 1 capsule (100 mg total) by mouth 2 (two) times daily as needed for cough.  Dispense: 20 capsule; Refill: 0 - albuterol  (VENTOLIN  HFA) 108 (90 Base) MCG/ACT inhaler; Inhale 2 puffs into the lungs every 4 (four) hours as needed for wheezing or shortness of breath.  Dispense: 2 each; Refill: 3   Follow Up Instructions: follow up in person if symptoms worsen or fail to improve    I discussed the assessment and treatment plan with the patient. The patient was provided an opportunity to ask questions and all were answered. The patient agreed with the plan and demonstrated an understanding of the instructions.   The patient was advised to call back or seek an in-person evaluation if the symptoms worsen or if the condition fails to improve as anticipated.  I provided 7 minutes of non-face-to-face time during this encounter.  Sharyle Dewan, DO

## 2024-04-30 ENCOUNTER — Other Ambulatory Visit: Payer: Self-pay

## 2024-04-30 DIAGNOSIS — Z122 Encounter for screening for malignant neoplasm of respiratory organs: Secondary | ICD-10-CM

## 2024-04-30 DIAGNOSIS — Z87891 Personal history of nicotine dependence: Secondary | ICD-10-CM

## 2024-05-01 ENCOUNTER — Telehealth: Payer: Self-pay

## 2024-05-01 NOTE — Telephone Encounter (Signed)
-----   Message from Sharyle Bridge, DO sent at 04/30/2024  2:04 PM EST ----- Lung cancer screening negative for anything suspicious, consistent with COPD and aortic atherosclerosis.  Plan to continue yearly screening.

## 2024-05-01 NOTE — Telephone Encounter (Signed)
 Pt.notified

## 2024-05-26 ENCOUNTER — Ambulatory Visit: Admitting: Internal Medicine

## 2024-09-25 ENCOUNTER — Encounter: Admitting: Internal Medicine
